# Patient Record
Sex: Male | Born: 1970 | Race: White | Hispanic: No | Marital: Married | Smoking: Never smoker
Health system: Southern US, Community
[De-identification: ages and names within clinical notes are randomized; demographics above are authoritative.]

## PROBLEM LIST (undated history)

## (undated) DIAGNOSIS — K5190 Ulcerative colitis, unspecified, without complications: Secondary | ICD-10-CM

## (undated) DIAGNOSIS — R972 Elevated prostate specific antigen [PSA]: Secondary | ICD-10-CM

## (undated) DIAGNOSIS — G43909 Migraine, unspecified, not intractable, without status migrainosus: Secondary | ICD-10-CM

## (undated) DIAGNOSIS — C61 Malignant neoplasm of prostate: Secondary | ICD-10-CM

## (undated) DIAGNOSIS — C4491 Basal cell carcinoma of skin, unspecified: Secondary | ICD-10-CM

## (undated) DIAGNOSIS — K4090 Unilateral inguinal hernia, without obstruction or gangrene, not specified as recurrent: Secondary | ICD-10-CM

## (undated) DIAGNOSIS — M1990 Unspecified osteoarthritis, unspecified site: Secondary | ICD-10-CM

## (undated) HISTORY — DX: Ulcerative colitis, unspecified, without complications: K51.90

## (undated) HISTORY — DX: Basal cell carcinoma of skin, unspecified: C44.91

## (undated) HISTORY — PX: COLONOSCOPY: SHX174

## (undated) HISTORY — DX: Migraine, unspecified, not intractable, without status migrainosus: G43.909

## (undated) HISTORY — PX: KNEE ARTHROSCOPY: SUR90

## (undated) HISTORY — PX: PROSTATE BIOPSY: SHX241

---

## 2006-05-09 ENCOUNTER — Emergency Department (HOSPITAL_COMMUNITY): Admission: EM | Admit: 2006-05-09 | Discharge: 2006-05-09 | Payer: Self-pay | Admitting: Emergency Medicine

## 2007-10-23 ENCOUNTER — Encounter: Payer: Self-pay | Admitting: Family Medicine

## 2009-01-18 ENCOUNTER — Ambulatory Visit: Payer: Self-pay | Admitting: Family Medicine

## 2009-01-18 DIAGNOSIS — S139XXA Sprain of joints and ligaments of unspecified parts of neck, initial encounter: Secondary | ICD-10-CM | POA: Insufficient documentation

## 2009-01-18 DIAGNOSIS — C4490 Unspecified malignant neoplasm of skin, unspecified: Secondary | ICD-10-CM

## 2009-05-24 ENCOUNTER — Ambulatory Visit: Payer: Self-pay | Admitting: Family Medicine

## 2009-05-25 LAB — CONVERTED CEMR LAB
Alkaline Phosphatase: 68 units/L (ref 39–117)
BUN: 14 mg/dL (ref 6–23)
Cholesterol: 189 mg/dL (ref 0–200)
Glucose, Bld: 107 mg/dL — ABNORMAL HIGH (ref 70–99)
HDL: 44 mg/dL (ref >39)
LDL Cholesterol: 115 mg/dL — ABNORMAL HIGH (ref 0–99)
Total Bilirubin: 0.6 mg/dL (ref 0.3–1.2)
Triglycerides: 149 mg/dL (ref <150)
VLDL: 30 mg/dL (ref 0–40)

## 2009-08-12 HISTORY — PX: KNEE ARTHROPLASTY: SHX992

## 2010-06-19 ENCOUNTER — Ambulatory Visit: Payer: Self-pay | Admitting: Family Medicine

## 2010-06-19 ENCOUNTER — Encounter: Admission: RE | Admit: 2010-06-19 | Discharge: 2010-06-19 | Payer: Self-pay | Admitting: Family Medicine

## 2010-06-19 DIAGNOSIS — M25569 Pain in unspecified knee: Secondary | ICD-10-CM

## 2010-06-21 ENCOUNTER — Encounter: Payer: Self-pay | Admitting: Family Medicine

## 2010-09-11 NOTE — Consult Note (Signed)
Summary: [facility]  [facility]   Imported By: Lanelle Bal 07/09/2010 10:54:17  _____________________________________________________________________  External Attachment:    Type:   Image     Comment:   External Document

## 2010-09-11 NOTE — Assessment & Plan Note (Signed)
Summary: L knee pain   Vital Signs:  Patient profile:   40 year old male Height:      78 inches Weight:      204 pounds BMI:     23.66 O2 Sat:      98 % on Room air Pulse rate:   76 / minute BP sitting:   137 / 76  (left arm) Cuff size:   regular  Vitals Entered By: Payton Spark CMA (June 19, 2010 2:24 PM)  O2 Flow:  Room air CC: Injured L knee while bending x 3 days ago.    Primary Care Asuncion Shibata:  Seymour Bars DO  CC:  Injured L knee while bending x 3 days ago. Marland Kitchen  History of Present Illness: 40 yo WM presents for pain in the L knee that started 3 days ago.  He has been busy hiking and builing a chicken coup.  He had been squatting and when he went to stand up, he felt like the knee was going to give way.  It swelled up later that day.  He used ice and ibuprofen but it still hurts today.  He has pain with pivoting.  Pain is over the medial joint line.  He feels like it is going to give way unless he is walking straight.  He has hx of MCL partial tear at age 40.    Used an ACE wrap yesterday.   Current Medications (verified): 1)  None  Allergies (verified): No Known Drug Allergies  Past History:  Past Medical History: Reviewed history from 01/18/2009 and no changes required. BCC- Dr Danella Deis  Social History: Reviewed history from 01/18/2009 and no changes required. 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  No kids. Has Masters Degree. Quit smoking.  1 pk yr hx. Does Aikido and Walking 60+ min daily. 4 ETOH / wk.  Review of Systems      See HPI  Physical Exam  General:  alert, well-developed, well-nourished, and well-hydrated.  tall, lanky Msk:  L kinee effusion w/o redness or warmth with prominent bakers cyst.  + medial sided McMurrays test.  Pain/ laxity over medial collateral with valgus strain.  neg patellar apprehension and normal lachmans sign Extremities:  no ankle or pedal edema   Impression & Recommendations:  Problem # 1:  KNEE  PAIN, LEFT, ACUTE (ICD-719.46) Based on hx and PE findings, he likely has a medial mediscal tear + / - a medical collateral strain with presence of effuison. Placed in knee sleeve today.  Xray today (likely normal).  Start RX Ibuprofen and ice.  Set up with Dr Margaretha Sheffield for arthrocentesis and steroid shot if indicated.  Likely will need MRI down line. His updated medication list for this problem includes:    Ibuprofen 800 Mg Tabs (Ibuprofen) .Marland Kitchen... 1 tab by mouth three times a day with food x 10 days  Orders: T-DG Knee 3 Views L (61950.6) Sports Medicine (Sports Med) Knee/thigh sleeve [phone])  Complete Medication List: 1)  Ibuprofen 800 Mg Tabs (Ibuprofen) .Marland Kitchen.. 1 tab by mouth three times a day with food x 10 days  Patient Instructions: 1)  Xray knee downstairs today. 2)  Will call you w/ results tomorrow. 3)  Use knee sleeve during the day. 4)  Ice knee 15 min 3 x a day. 5)  Start RX Ibuprofen 3 x a day with meals x 10 days for pain and swelling. 6)  Will set you up with Dr Margaretha Sheffield in [place] to follow. Prescriptions:  IBUPROFEN 800 MG TABS (IBUPROFEN) 1 tab by mouth three times a day with food x 10 days  #30 x 0   Entered and Authorized by:   Seymour Bars DO   Signed by:   Seymour Bars DO on 06/19/2010   Method used:   Electronically to        [facility] [address] [phone]* (retail)       [address]       [place], [place]  [address]       Ph: [phone] or [phone]       Fax: [phone]   RxID:   [id number]    Orders Added: 1)  T-DG Knee 3 Views L [73562.6] 2)  Sports Medicine [Sports Med] 3)  Est. Patient Level III [57322] 4)  Knee/thigh sleeve [L1825]

## 2011-03-17 ENCOUNTER — Encounter: Payer: Self-pay | Admitting: Family Medicine

## 2011-03-18 ENCOUNTER — Other Ambulatory Visit: Payer: Self-pay | Admitting: Family Medicine

## 2011-03-18 MED ORDER — EPINEPHRINE 0.3 MG/0.3ML IJ DEVI: 0.3000 mg | Freq: Once | INTRAMUSCULAR | Status: DC

## 2011-03-18 NOTE — Telephone Encounter (Signed)
Added but you don't have a pharmacy listed.

## 2011-03-18 NOTE — Telephone Encounter (Signed)
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  Was told he needed an epi pen which is reasonable.  Getting ready to go on vacation and would like to get the prescription sent prior to leaving.  Pt last office visit was 06-19-10.  Epi pen not on pt med list in old or new system.  Please advise Routed to Dr. Arlice Colt, LPN [facility]

## 2011-03-19 ENCOUNTER — Telehealth: Payer: Self-pay | Admitting: Family Medicine

## 2011-03-19 ENCOUNTER — Other Ambulatory Visit: Payer: Self-pay | Admitting: Family Medicine

## 2011-03-19 ENCOUNTER — Ambulatory Visit (INDEPENDENT_AMBULATORY_CARE_PROVIDER_SITE_OTHER): Payer: PRIVATE HEALTH INSURANCE | Admitting: Family Medicine

## 2011-03-19 ENCOUNTER — Encounter: Payer: Self-pay | Admitting: Family Medicine

## 2011-03-19 VITALS — BP 133/71 | HR 62 | Temp 98.3°F | Ht 78.0 in | Wt 196.0 lb

## 2011-03-19 DIAGNOSIS — G43909 Migraine, unspecified, not intractable, without status migrainosus: Secondary | ICD-10-CM

## 2011-03-19 DIAGNOSIS — Z91038 Other insect allergy status: Secondary | ICD-10-CM | POA: Insufficient documentation

## 2011-03-19 DIAGNOSIS — Z13228 Encounter for screening for other metabolic disorders: Secondary | ICD-10-CM

## 2011-03-19 DIAGNOSIS — Z91030 Bee allergy status: Secondary | ICD-10-CM | POA: Insufficient documentation

## 2011-03-19 DIAGNOSIS — T6391XA Toxic effect of contact with unspecified venomous animal, accidental (unintentional), initial encounter: Secondary | ICD-10-CM

## 2011-03-19 DIAGNOSIS — T63461A Toxic effect of venom of wasps, accidental (unintentional), initial encounter: Secondary | ICD-10-CM

## 2011-03-19 DIAGNOSIS — Z13220 Encounter for screening for lipoid disorders: Secondary | ICD-10-CM

## 2011-03-19 MED ORDER — NARATRIPTAN HCL 2.5 MG PO TABS: 2.5000 mg | ORAL_TABLET | ORAL | Status: DC | PRN

## 2011-03-19 MED ORDER — EPINEPHRINE 0.3 MG/0.3ML IJ DEVI: 0.3000 mg | Freq: Once | INTRAMUSCULAR | Status: DC

## 2011-03-19 NOTE — Patient Instructions (Signed)
Update fasting labs one morning downstairs. Will call you w/ results.  Change Axert to Naratriptan to use for migraine rescue. Let me know if any problems on this.

## 2011-03-19 NOTE — Assessment & Plan Note (Signed)
RX for Epi Pen given.  Reviewed proper use.  Call if any problems.

## 2011-03-19 NOTE — Progress Notes (Signed)
  Subjective:    Patient ID: Stephen Ferrell, male    DOB: [birth year], 40 y.o.   MRN: [id number]  HPI  40 yo WM presents for RX for Epi Pen.  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  His last reaction was that his leg swelled very large.  Has not had trouble breathing.  He rarely has migraines.  Has used Axert which works well but is not covered by [employer] education [employer].  He tried Maxalt in the past but it caused problems with his eyes.  He gets about 4 migraines/ year.    BP 133/71  Pulse 62  Temp(Src) 98.3 F (36.8 C) (Oral)  Ht 6\' 6"  (1.981 m)  Wt 196 lb (88.905 kg)  BMI 22.65 kg/m2   Review of Systems  Constitutional: Negative for fatigue.  Eyes: Negative for visual disturbance.  Respiratory: Negative for shortness of breath.   Cardiovascular: Negative for chest pain.  Neurological: Positive for headaches (rare).       Objective:   Physical Exam  Constitutional: He appears well-developed and well-nourished.  HENT:  Mouth/Throat: Oropharynx is clear and moist.  Neck: No thyromegaly present.  Cardiovascular: Normal rate, regular rhythm and normal heart sounds.   No murmur heard. Pulmonary/Chest: Effort normal and breath sounds normal.  Musculoskeletal: He exhibits no edema.  Skin: Skin is warm and dry.  Psychiatric: He has a normal mood and affect.          Assessment & Plan:

## 2011-03-19 NOTE — Telephone Encounter (Signed)
The pharmacy is loaded [facility].  LMOM for the pt that the script was sent to his pharmacy. Jarvis Newcomer, LPN [facility] Dimes

## 2011-03-19 NOTE — Telephone Encounter (Signed)
Prescription for Epi-Pen faxed to pt local pharmacy. Jarvis Newcomer, LPN [facility] Dimes

## 2011-03-19 NOTE — Assessment & Plan Note (Signed)
Axert changed to Naratriptan for cost.  Call if this doesn't work or has problems with it.

## 2011-03-20 NOTE — Telephone Encounter (Signed)
Closed encounter °

## 2011-03-21 ENCOUNTER — Telehealth: Payer: Self-pay | Admitting: Family Medicine

## 2011-03-21 NOTE — Telephone Encounter (Signed)
Pls call pt.  I got permission for him to transfer care to Dr Terrilee Files at [facility] in [place].  All he has to do is call the main # here [phone] and ask for Dennison Nancy, RN.   Thanks!

## 2011-03-21 NOTE — Telephone Encounter (Signed)
Pt notified. KJ LPN 

## 2011-06-12 ENCOUNTER — Telehealth: Payer: Self-pay | Admitting: Family Medicine

## 2011-06-12 NOTE — Telephone Encounter (Signed)
Mr. Charlesworth is calling asking for Stephen Ferrell.  Dr. Cathey Endow is ending her practice and he was told that he needs to get in touch with Olegario Messier in order to become a patient of Dr. Lewie Loron.

## 2011-06-13 NOTE — Telephone Encounter (Signed)
Dr. Cathey Endow did discuss this patient with me.  He prefers a DO so will assign to Dr. Katrinka Blazing.  Will have our NP coordinator contact him to set up an appointment.

## 2011-06-13 NOTE — Telephone Encounter (Signed)
Thank you for the information happy to meet this individual

## 2011-06-20 NOTE — Telephone Encounter (Signed)
Left vm for pt to return call to schedule np appt.

## 2011-07-22 ENCOUNTER — Encounter (HOSPITAL_COMMUNITY): Payer: Self-pay | Admitting: Emergency Medicine

## 2011-07-22 ENCOUNTER — Emergency Department (INDEPENDENT_AMBULATORY_CARE_PROVIDER_SITE_OTHER)
Admission: EM | Admit: 2011-07-22 | Discharge: 2011-07-22 | Disposition: A | Discharge status: Home / Self Care | Payer: 59 | Source: Home / Self Care | Attending: Emergency Medicine | Admitting: Emergency Medicine

## 2011-07-22 DIAGNOSIS — R05 Cough: Secondary | ICD-10-CM

## 2011-07-22 MED ORDER — GUAIFENESIN-CODEINE 100-10 MG/5ML PO SYRP: 5.0000 mL | ORAL_SOLUTION | Freq: Two times a day (BID) | ORAL | Status: DC | PRN

## 2011-07-22 NOTE — ED Notes (Signed)
Since last Tuesday cold symptoms getting worse. Start off in head and now seems to have settled in chest. Aches, pains, chills, severe cough headache, sinus pain.

## 2011-07-22 NOTE — ED Provider Notes (Signed)
40 year old male with 6 days of cough. Initially his symptoms included headaches body aches fevers and chills. He treated the symptoms with DayQuil and NyQuil and nasal lavage. He has been feeling much better however he still has a persistent productive cough. He denies any dyspnea or current fevers or chills. He has no personal medical history of asthma or COPD. He feels well overall but does note this persistent cough is bothersome. He did not have a flu shot this year.  PMH reviewed.  ROS as above otherwise neg Medications reviewed. No current facility-administered medications for this encounter.   Current Outpatient Prescriptions  Medication Sig Dispense Refill  . EPINEPHrine (EPIPEN) 0.3 mg/0.3 mL DEVI Inject 0.3 mLs (0.3 mg total) into the muscle once.  1 Device  2  . guaiFENesin-codeine (ROBITUSSIN AC) 100-10 MG/5ML syrup Take 5 mLs by mouth 2 (two) times daily as needed for cough.  240 mL  0  . ibuprofen (ADVIL,MOTRIN) 800 MG tablet Take 800 mg by mouth every 8 (eight) hours as needed.        . naratriptan (AMERGE) 2.5 MG tablet Take 1 tablet (2.5 mg total) by mouth as needed for migraine. Take one (1) tablet at onset of headache; if returns or does not resolve, may repeat after 4 hours; do not exceed five (5) mg in 24 hours.  10 tablet  0    Exam:  BP 113/77  Pulse 71  Temp(Src) 99.1 F (37.3 C) (Oral)  Resp 20  SpO2 97% Gen: Well NAD HEENT: EOMI,  MMM Lungs: Normal work of breathing, rhonchi present bilaterally   normal expiratory phase no crackles or wheezes present Heart: RRR no MRG Abd: NABS, NT, ND Exts: Non edematous BL  LE, warm and well perfused.    Assessment and plan: 40 year old male with post viral cough likely do to influenza. On the recovery currently. Oxygen saturation is normal therefore I feel like x-rays not warranted. We'll treat cough with prescription cough medication. Discuss red flags  signs and symptoms and the duration of expected post viral cough.  Will followup with primary care provider if no improvement in 2 weeks.  Stephen Ferrell 07/22/11 1939

## 2011-07-25 ENCOUNTER — Encounter: Payer: Self-pay | Admitting: Family Medicine

## 2011-07-25 ENCOUNTER — Ambulatory Visit (INDEPENDENT_AMBULATORY_CARE_PROVIDER_SITE_OTHER): Payer: Self-pay | Admitting: Family Medicine

## 2011-07-25 ENCOUNTER — Other Ambulatory Visit: Payer: Self-pay | Admitting: Family Medicine

## 2011-07-25 VITALS — BP 119/72 | HR 73 | Temp 98.2°F | Ht 78.0 in | Wt 192.0 lb

## 2011-07-25 DIAGNOSIS — Z13220 Encounter for screening for lipoid disorders: Secondary | ICD-10-CM

## 2011-07-25 DIAGNOSIS — G43909 Migraine, unspecified, not intractable, without status migrainosus: Secondary | ICD-10-CM

## 2011-07-25 DIAGNOSIS — M25569 Pain in unspecified knee: Secondary | ICD-10-CM

## 2011-07-25 DIAGNOSIS — R5383 Other fatigue: Secondary | ICD-10-CM

## 2011-07-25 DIAGNOSIS — J069 Acute upper respiratory infection, unspecified: Secondary | ICD-10-CM

## 2011-07-25 LAB — CBC WITH DIFFERENTIAL/PLATELET
Basophils Absolute: 0 10*3/uL (ref 0.0–0.1)
Eosinophils Relative: 2 % (ref 0–5)
HCT: 44.5 % (ref 39.0–52.0)
Hemoglobin: 15.8 g/dL (ref 13.0–17.0)
Lymphocytes Relative: 32 % (ref 12–46)
Lymphs Abs: 2.1 10*3/uL (ref 0.7–4.0)
MCV: 89 fL (ref 78.0–100.0)
Monocytes Absolute: 0.4 10*3/uL (ref 0.1–1.0)
Monocytes Relative: 7 % (ref 3–12)
Neutro Abs: 4 10*3/uL (ref 1.7–7.7)
RDW: 12.7 % (ref 11.5–15.5)
WBC: 6.7 10*3/uL (ref 4.0–10.5)

## 2011-07-25 LAB — LIPID PANEL
LDL Cholesterol: 100 mg/dL — ABNORMAL HIGH (ref 0–99)
Total CHOL/HDL Ratio: 4.3 Ratio

## 2011-07-25 LAB — BASIC METABOLIC PANEL
CO2: 29 mEq/L (ref 19–32)
Glucose, Bld: 93 mg/dL (ref 70–99)
Potassium: 4.2 mEq/L (ref 3.5–5.3)
Sodium: 139 mEq/L (ref 135–145)

## 2011-07-25 MED ORDER — ALBUTEROL 90 MCG/ACT IN AERS: 2.0000 | INHALATION_SPRAY | Freq: Four times a day (QID) | RESPIRATORY_TRACT | Status: DC | PRN

## 2011-07-25 NOTE — Progress Notes (Signed)
  Subjective:    Patient ID: Stephen Ferrell, male    DOB: 01-Aug-1971, 40 y.o.   MRN: [id number]  HPI Pt is here to establish care.  Recent flu- Pt stats last week had fever, chills, myalgia, and cough.  Pt was given cough syrup and seemed to improve but still has the cough, dry non productive, denies CP or SOB. Pt able to do all activities without much problems. Pt states it continues to get better.   Cervical strain- Pt was seen at [facility] before for this and partial tear in his meniscus. Pt though is feeling better but moved from [place], received manipulation therapy and would like to have it again.   Migraines- Have been well controlled having them very infrequent.  Pt states that they usually respond to Motrin at this time.   Left knee pain-  Pt states much improved at ths time, has started to workout a little more and is not having any pain.  Pt states he does not even have pain with stairs or cold weather.   Review of Systems Denies fever, chills, nausea vomiting abdominal pain, dysuria, chest pain, shortness of breath dyspnea on exertion or numbness in extremities Past medical history, social, surgical and family history all reviewed.      Objective:   Physical Exam Constitutional: He appears well-developed and well-nourished.  HENT:  Mouth/Throat: Oropharynx is clear and moist.  Neck: No thyromegaly present.  Cardiovascular: Normal rate, regular rhythm and normal heart sounds.   No murmur heard. Pulmonary/Chest: Effort normal and breath sounds normal.  Musculoskeletal: He exhibits no edema.  Skin: Skin is warm and dry.  Psychiatric: He has a normal mood and affect.   OMT Findings: Cervical: C3 flexed rotated and side bent left      C6 extended her rotated and side there Thoracic: T5 extended rotated and side bent right patient also has a right fifth rib inhaled position Lumbar: L3 rotated and side bent left in flexion Sacrum: Left on left with an anterior ilium    Assessment & Plan:

## 2011-07-25 NOTE — Assessment & Plan Note (Signed)
Well controlled no need for medication or management change.

## 2011-07-25 NOTE — Assessment & Plan Note (Signed)
Partial meniscus tear seen on imaging and now though seems well healed will make no changes at this time.

## 2011-07-25 NOTE — Patient Instructions (Signed)
Good to see you. I will get the labs and call you with the results. At that time then your formal be ready for pickup. This likely will be done next week. I'm giving you a inhaler to try to hopefully help with her cough probably use this for the next week or 2. Your more than welcome to come in for manipulation as you need it. Right now there was not much better I would say maybe another time in 6-8 weeks. Otherwise happy holidays and happy new year.

## 2012-04-19 ENCOUNTER — Other Ambulatory Visit: Payer: Self-pay | Admitting: Family Medicine

## 2012-04-29 ENCOUNTER — Other Ambulatory Visit: Payer: Self-pay | Admitting: *Deleted

## 2012-04-29 DIAGNOSIS — T7840XA Allergy, unspecified, initial encounter: Secondary | ICD-10-CM

## 2012-04-29 MED ORDER — EPINEPHRINE 0.3 MG/0.3ML IJ DEVI: 0.3000 mg | Freq: Once | INTRAMUSCULAR | Status: DC

## 2012-11-29 ENCOUNTER — Ambulatory Visit: Payer: PRIVATE HEALTH INSURANCE

## 2012-11-29 ENCOUNTER — Encounter: Payer: Self-pay | Admitting: Family Medicine

## 2012-11-29 ENCOUNTER — Ambulatory Visit (INDEPENDENT_AMBULATORY_CARE_PROVIDER_SITE_OTHER): Payer: PRIVATE HEALTH INSURANCE | Admitting: Family Medicine

## 2012-11-29 DIAGNOSIS — S93409A Sprain of unspecified ligament of unspecified ankle, initial encounter: Secondary | ICD-10-CM

## 2012-11-29 DIAGNOSIS — M25571 Pain in right ankle and joints of right foot: Secondary | ICD-10-CM

## 2012-11-29 DIAGNOSIS — S91309A Unspecified open wound, unspecified foot, initial encounter: Secondary | ICD-10-CM

## 2012-11-29 DIAGNOSIS — M79609 Pain in unspecified limb: Secondary | ICD-10-CM

## 2012-11-29 DIAGNOSIS — Z23 Encounter for immunization: Secondary | ICD-10-CM

## 2012-11-29 DIAGNOSIS — M25579 Pain in unspecified ankle and joints of unspecified foot: Secondary | ICD-10-CM

## 2012-11-29 DIAGNOSIS — S91301A Unspecified open wound, right foot, initial encounter: Secondary | ICD-10-CM

## 2012-11-29 DIAGNOSIS — S93401A Sprain of unspecified ligament of right ankle, initial encounter: Secondary | ICD-10-CM

## 2012-11-29 NOTE — Progress Notes (Signed)
Verbal consent obtained from the patient.  Local anesthesia with 2cc Lidocaine 2% without epinephrine.  Wound scrubbed with soap and water and rinsed.  Wound closed with #3 5-0 Prolene horizontal mattress (#1) and simple interrupted (#2) sutures.  Wound cleansed and dressed.

## 2012-11-29 NOTE — Patient Instructions (Addendum)

## 2012-11-29 NOTE — Progress Notes (Signed)
[address]   [place], [place]  [address]   [phone]  Subjective:    Patient ID: Stephen Ferrell, male    DOB: 05/04/1971, 42 y.o.   MRN: [id number]  HPI This 42 y.o. male presents for evaluation of R foot laceration.  Occurred at 10:30 this morning; one hour ago.  Slipped on ladder.  Tetanus vaccine several years ago; probably over five years.  75 pounds of weight came down on foot.  Some pain.  Ladder landed directly on foot and cut between 4th and 5th digits.  Not interested in xray; history of broken toes multiple in past.    2.  R ankle pain: injured ankle four days ago.  No swelling; Lateral ankle pain; pain with rotating foot side to side.  No n/t.  No limping.  Pain less severe now.    PCP: Deboraha Sprang   Review of Systems  Constitutional: Negative for fever, chills, diaphoresis and fatigue.  Musculoskeletal: Positive for myalgias, arthralgias and gait problem. Negative for joint swelling.  Skin: Positive for wound.  Neurological: Negative for weakness and numbness.        Past Medical History  Diagnosis Date  . BCC (basal cell carcinoma of skin)   . Migraines     Past Surgical History  Procedure Laterality Date  . Knee arthroplasty  2011    Prior to Admission medications   Medication Sig Start Date End Date Taking? Authorizing Provider  ibuprofen (ADVIL,MOTRIN) 800 MG tablet Take 800 mg by mouth every 8 (eight) hours as needed.     Yes Historical Provider, MD  albuterol (PROVENTIL,VENTOLIN) 90 MCG/ACT inhaler Inhale 2 puffs into the lungs every 6 (six) hours as needed for wheezing. 07/25/11 07/19/12  Judi Saa, DO  EPINEPHrine (EPIPEN) 0.3 mg/0.3 mL DEVI Inject 0.3 mLs (0.3 mg total) into the muscle once. 04/29/12   Ozella Rocks, MD  naratriptan (AMERGE) 2.5 MG tablet Take 1 tablet (2.5 mg total) by mouth as needed for migraine. Take one (1) tablet at onset of headache; if returns or does not resolve, may repeat after 4 hours; do not exceed five (5) mg in 24  hours. 03/19/11 03/18/12  Seymour Bars, DO    No Known Allergies  History   Social History  . Marital Status: Married    Spouse Name: N/A    Number of Children: N/A  . Years of Education: N/A   Occupational History  . Not on file.   Social History Main Topics  . Smoking status: Former Games developer  . Smokeless tobacco: Not on file  . Alcohol Use: 2.0 oz/week    4 drink(s) per week     Comment: per week  . Drug Use:   . Sexually Active:    Other Topics Concern  . Not on file   Social History Narrative  . No narrative on file    Family History  Problem Relation Age of Onset  . Cancer Mother     melanoma, breast cancer  . Hypertension Father   . Hyperlipidemia Father   . Mental illness Brother     Objective:   Physical Exam  Nursing note and vitals reviewed. Constitutional: He is oriented to person, place, and time. He appears well-developed and well-nourished. No distress.  Cardiovascular: Intact distal pulses.   Pulses:      Dorsalis pedis pulses are 2+ on the right side, and 2+ on the left side.  Musculoskeletal:       Right ankle: He  exhibits normal range of motion, no swelling, no ecchymosis, no deformity, no laceration and normal pulse. No tenderness. No lateral malleolus, no medial malleolus and no proximal fibula tenderness found. Achilles tendon normal. Achilles tendon exhibits no pain, no defect and normal Thompson's test results.       Right foot: He exhibits tenderness and laceration. He exhibits normal range of motion, no bony tenderness, no swelling and no deformity.       Feet:  R FOOT:  +TTP DISTAL FIFTH METATARSAL; 5TH TOE WITHOUT TTP; NORMAL ROM DIGITS X 5.  NO TTP METATARSAL SQUEEZE.  Neurological: He is alert and oriented to person, place, and time.  Skin: He is not diaphoretic.  R FOOT:  +LACERATION INTERDIGIT 4TH, 5TH WITH SCANT BLEEDING.  Psychiatric: He has a normal mood and affect. His behavior is normal.   TDAP ADMINISTERED IN OFFICE BY  REBEKAH.  [facility] reading (PRIMARY) by  Dr. Katrinka Blazing.  R FOOT: NAD.  R ANKLE:  NAD  PROCEDURE NOTE: SEE SEPARATE PROCEDURE NOTE.        Assessment & Plan:  Pain, foot, right - Plan: DG Foot Complete Right  Wound, open, foot, right, initial encounter - Plan: Tdap vaccine greater than or equal to 7yo IM  Pain in joint, ankle and foot, right - Plan: DG Ankle Complete Right  Sprain of ankle, right, initial encounter  1. Pain R foot:  New.  Secondary to contusion, laceration. 2.  Wound/laceration R foot:  New.  S/p suture repair; s/p TDAP in office.  RTC 7-10 days for suture removal.  Local wound care; RTC for drainage, redness, pain. 3.  Contusion R foot:  New.  Secondary to trauma from ladder.  Recommend rest, elevation, Ibuprofen, ice. 4.  Pain R ankle; sprain R lateral ankle: New.  Secondary to trauma; recommend rest, passive ROM, Ibuprofen, ice.

## 2015-01-23 ENCOUNTER — Other Ambulatory Visit: Payer: Self-pay | Admitting: Internal Medicine

## 2015-01-23 ENCOUNTER — Ambulatory Visit
Admission: RE | Admit: 2015-01-23 | Discharge: 2015-01-23 | Disposition: A | Discharge status: Home / Self Care | Payer: No Typology Code available for payment source | Source: Ambulatory Visit | Attending: Internal Medicine | Admitting: Internal Medicine

## 2015-01-23 DIAGNOSIS — M545 Low back pain: Secondary | ICD-10-CM

## 2015-01-25 ENCOUNTER — Other Ambulatory Visit: Payer: Self-pay | Admitting: Internal Medicine

## 2015-01-25 DIAGNOSIS — M545 Low back pain: Secondary | ICD-10-CM

## 2015-02-06 ENCOUNTER — Ambulatory Visit
Admission: RE | Admit: 2015-02-06 | Discharge: 2015-02-06 | Disposition: A | Discharge status: Home / Self Care | Payer: PRIVATE HEALTH INSURANCE | Source: Ambulatory Visit | Attending: Internal Medicine | Admitting: Internal Medicine

## 2015-02-06 DIAGNOSIS — M545 Low back pain: Secondary | ICD-10-CM

## 2016-03-07 ENCOUNTER — Emergency Department (HOSPITAL_COMMUNITY)
Admission: EM | Admit: 2016-03-07 | Discharge: 2016-03-08 | Disposition: A | Discharge status: Home / Self Care | Payer: 59 | Attending: Emergency Medicine | Admitting: Emergency Medicine

## 2016-03-07 ENCOUNTER — Emergency Department (HOSPITAL_COMMUNITY): Payer: 59

## 2016-03-07 ENCOUNTER — Encounter (HOSPITAL_COMMUNITY): Payer: Self-pay | Admitting: *Deleted

## 2016-03-07 DIAGNOSIS — Z87891 Personal history of nicotine dependence: Secondary | ICD-10-CM | POA: Diagnosis not present

## 2016-03-07 DIAGNOSIS — Y9301 Activity, walking, marching and hiking: Secondary | ICD-10-CM | POA: Diagnosis not present

## 2016-03-07 DIAGNOSIS — Y9289 Other specified places as the place of occurrence of the external cause: Secondary | ICD-10-CM | POA: Insufficient documentation

## 2016-03-07 DIAGNOSIS — W01198A Fall on same level from slipping, tripping and stumbling with subsequent striking against other object, initial encounter: Secondary | ICD-10-CM | POA: Diagnosis not present

## 2016-03-07 DIAGNOSIS — S81012A Laceration without foreign body, left knee, initial encounter: Secondary | ICD-10-CM | POA: Insufficient documentation

## 2016-03-07 DIAGNOSIS — Y999 Unspecified external cause status: Secondary | ICD-10-CM | POA: Diagnosis not present

## 2016-03-07 DIAGNOSIS — S81812A Laceration without foreign body, left lower leg, initial encounter: Secondary | ICD-10-CM

## 2016-03-07 MED ORDER — LIDOCAINE-EPINEPHRINE 2 %-1:100000 IJ SOLN
20.0000 mL | Freq: Once | INTRAMUSCULAR | Status: AC
  Administered 2016-03-08: 20 mL
  Filled 2016-03-07: qty 1

## 2016-03-07 NOTE — ED Notes (Signed)
Wound irrigated with saline at this time.

## 2016-03-07 NOTE — ED Provider Notes (Signed)
[facility] DEPT Provider Note   CSN: [id number] Arrival date & time: 03/07/16  2159  First Provider Contact:  None       History   Chief Complaint Chief Complaint  Patient presents with  . Extremity Laceration    HPI Stephen Ferrell is a 45 y.o. male with no significant pmhx who presents to the ED today c/o laceration to left knee. Pt states that he was walking out on his back patio in the dark when he accidentally tripped and landed on some stones. Pt states that he accidentally kicked one of the stones with his left foot and landed on his left knee causing a laceration. Pt is concerned that he may have broken his patella. He has been ambulatory without difficulty since. No other trauma or injury. Last tetanus was 4 years ago.   HPI  Past Medical History:  Diagnosis Date  . BCC (basal cell carcinoma of skin)   . Migraines     Patient Active Problem List   Diagnosis Date Noted  . Screening cholesterol level 07/25/2011  . Fatigue 07/25/2011  . URI (upper respiratory infection) 07/25/2011  . 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 Migraines 03/19/2011  . KNEE PAIN, LEFT, ACUTE 06/19/2010  . CARCINOMA, BASAL CELL 01/18/2009  . CERVICAL STRAIN 01/18/2009    Past Surgical History:  Procedure Laterality Date  . KNEE ARTHROPLASTY  2011       Home Medications    Prior to Admission medications   Medication Sig Start Date End Date Taking? Authorizing Provider  albuterol (PROVENTIL,VENTOLIN) 90 MCG/ACT inhaler Inhale 2 puffs into the lungs every 6 (six) hours as needed for wheezing. 07/25/11 07/19/12  Lyndal Pulley, DO  EPINEPHrine (EPIPEN) 0.3 mg/0.3 mL DEVI Inject 0.3 mLs (0.3 mg total) into the muscle once. 04/29/12   Waldemar Dickens, MD  ibuprofen (ADVIL,MOTRIN) 800 MG tablet Take 800 mg by mouth every 8 (eight) hours as needed.      Historical Provider, MD  naratriptan (AMERGE) 2.5 MG tablet Take 1 tablet (2.5 mg total) by mouth as needed for migraine. Take one (1) tablet  at onset of headache; if returns or does not resolve, may repeat after 4 hours; do not exceed five (5) mg in 24 hours. 03/19/11 03/18/12  Dell Ponto, DO    Family History Family History  Problem Relation Age of Onset  . Cancer Mother     melanoma, breast cancer  . Hypertension Father   . Hyperlipidemia Father   . Mental illness Brother     Social History Social History  Substance Use Topics  . Smoking status: Former Research scientist (life sciences)  . Smokeless tobacco: Never Used  . Alcohol use 2.0 oz/week    4 Standard drinks or equivalent per week     Comment: per week     Allergies   Review of patient's allergies indicates no known allergies.   Review of Systems Review of Systems  All other systems reviewed and are negative.    Physical Exam Updated Vital Signs BP 118/83 (BP Location: Left Arm)   Pulse 71   Temp 98.3 F (36.8 C) (Oral)   SpO2 96%   Physical Exam  Constitutional: He is oriented to person, place, and time. He appears well-developed and well-nourished. No distress.  HENT:  Head: Normocephalic and atraumatic.  Eyes: Conjunctivae are normal. Right eye exhibits no discharge. Left eye exhibits no discharge. No scleral icterus.  Cardiovascular: Normal rate.   Pulmonary/Chest: Effort normal.  Musculoskeletal:  Negative  anterior/poster drawer bilaterally. Negative ballottement test. No varus or valgus laxity. No crepitus. No decreased range of motion of knee. Patient able to lift up the left leg without difficulty.  Neurological: He is alert and oriented to person, place, and time. Coordination normal.  Skin: Skin is warm and dry. No rash noted. He is not diaphoretic. No erythema. No pallor.  3 cm horizontal laceration across the anterior aspect of left knee. Wound is dirty with multiple specks of grass and dirt. No edema, erythema or warmth.  Psychiatric: He has a normal mood and affect. His behavior is normal.  Nursing note and vitals reviewed.    ED Treatments / Results    Labs (all labs ordered are listed, but only abnormal results are displayed) Labs Reviewed - No data to display  EKG  EKG Interpretation None       Radiology Dg Knee Complete 4 Views Left  Result Date: 03/07/2016 CLINICAL DATA:  Post fall, now with laceration near the patella. EXAM: LEFT KNEE - COMPLETE 4+ VIEW COMPARISON:  None. FINDINGS: There is a suspected laceration involving the soft tissues anterior to the patella. No associated radiopaque foreign body or fracture. Suspected small joint effusion. Joint spaces are preserved. No evidence of chondrocalcinosis. IMPRESSION: 1. Small laceration involving the soft tissues anterior the patella without associated fracture radiopaque foreign body. 2. Suspected small elbow joint effusion. Electronically Signed   By: Sandi Mariscal M.D.   On: 03/07/2016 23:05  Dg Foot Complete Left  Result Date: 03/07/2016 CLINICAL DATA:  Post fall, now with left foot pain. EXAM: LEFT FOOT - COMPLETE 3+ VIEW COMPARISON:  None. FINDINGS: No fracture or dislocation. Joint spaces are preserved. No significant hallux valgus deformity. No erosions. Regional soft tissues appear normal. No radiopaque foreign body. Prominent osteophyte is noted arising from the cranial aspect of talar beak, potentially the sequela of remote avulsive injury versus an accessory ossicle. IMPRESSION: No acute findings. Electronically Signed   By: Sandi Mariscal M.D.   On: 03/07/2016 23:04   Procedures .Marland KitchenLaceration Repair Date/Time: 03/08/2016 1:18 AM Performed by: Donnald Garre TRIPP Authorized by: Donnald Garre TRIPP   Consent:    Consent obtained:  Verbal   Consent given by:  Patient   Risks discussed:  Infection, retained foreign body and pain   Alternatives discussed:  No treatment Anesthesia (see MAR for exact dosages):    Anesthesia method:  Local infiltration   Local anesthetic:  Lidocaine 2% WITH epi Laceration details:    Location:  Leg   Leg location:  L knee    Length (cm):  3   Depth (mm):  1 Pre-procedure details:    Preparation:  Patient was prepped and draped in usual sterile fashion and imaging obtained to evaluate for foreign bodies Exploration:    Hemostasis achieved with:  Epinephrine   Wound exploration: entire depth of wound probed and visualized     Wound extent: foreign bodies/material     Wound extent: no nerve damage noted, no tendon damage noted and no underlying fracture noted   Treatment:    Area cleansed with:  Betadine   Amount of cleaning:  Standard   Irrigation solution:  Sterile water   Irrigation method:  Pressure wash   Visualized foreign bodies/material removed: yes   Skin repair:    Repair method:  Sutures   Suture size:  3-0   Suture material:  Prolene   Suture technique:  Simple interrupted   Number of sutures:  6 Approximation:  Approximation:  Close   Vermilion border: well-aligned   Post-procedure details:    Dressing:  Antibiotic ointment and non-adherent dressing   Patient tolerance of procedure:  Tolerated well, no immediate complications   (including critical care time)  Medications Ordered in ED Medications  lidocaine-EPINEPHrine (XYLOCAINE W/EPI) 2 %-1:100000 (with pres) injection 20 mL (not administered)     Initial Impression / Assessment and Plan / ED Course  I have reviewed the triage vital signs and the nursing notes.  Pertinent labs & imaging results that were available during my care of the patient were reviewed by me and considered in my medical decision making (see chart for details).  Clinical Course   Tdap UTD. Pressure irrigation performed. Laceration occurred < 8 hours prior to repair which was well tolerated. Pt has no co morbidities to effect normal wound healing. Discussed suture home care w pt and answered questions. Pt to f-u for wound check and suture removal in 7 days. Pt is hemodynamically stable w no complaints prior to dc.     Final Clinical Impressions(s) / ED  Diagnoses   Final diagnoses:  Leg laceration, left, initial encounter    New Prescriptions New Prescriptions   No medications on file     Carlos Levering, PA-C 03/08/16 0122    Charlesetta Shanks, MD 03/14/16 1640

## 2016-03-08 MED ORDER — BACITRACIN ZINC 500 UNIT/GM EX OINT
TOPICAL_OINTMENT | Freq: Once | CUTANEOUS | Status: AC
  Administered 2016-03-08: 1 via TOPICAL
  Filled 2016-03-08: qty 28.35

## 2016-03-08 NOTE — ED Notes (Signed)
Pt given discharge instructions, verbalized understanding of need to follow up, reasons to return to the ED and medications to take at home for continued pain. Pt denied further questions or concerns. Pt able to ambulate without difficulty.

## 2016-03-08 NOTE — Discharge Instructions (Signed)
May wash with antibacterial soap and water. Otherwise, keep clean and dry. Follow up with your primary care provider or an  urgent care for suture removal in 7 days. Return to the ED if you experience severe worsening of your symptoms, redness or swelling around your wound, fevers or chills. Try to avoid bending your knee in order to prevent your sutures from popping out.

## 2016-07-15 ENCOUNTER — Encounter (INDEPENDENT_AMBULATORY_CARE_PROVIDER_SITE_OTHER): Payer: Self-pay

## 2016-07-15 ENCOUNTER — Ambulatory Visit (INDEPENDENT_AMBULATORY_CARE_PROVIDER_SITE_OTHER): Payer: PRIVATE HEALTH INSURANCE | Admitting: Allergy & Immunology

## 2016-07-15 ENCOUNTER — Encounter: Payer: Self-pay | Admitting: Allergy & Immunology

## 2016-07-15 VITALS — BP 122/64 | HR 56 | Temp 97.6°F | Resp 16 | Ht 76.58 in | Wt 185.8 lb

## 2016-07-15 DIAGNOSIS — K51919 Ulcerative colitis, unspecified with unspecified complications: Secondary | ICD-10-CM

## 2016-07-15 DIAGNOSIS — T63441S Toxic effect of venom of bees, accidental (unintentional), sequela: Secondary | ICD-10-CM

## 2016-07-15 DIAGNOSIS — T781XXD Other adverse food reactions, not elsewhere classified, subsequent encounter: Secondary | ICD-10-CM | POA: Diagnosis not present

## 2016-07-15 DIAGNOSIS — IMO0001 Reserved for inherently not codable concepts without codable children: Secondary | ICD-10-CM

## 2016-07-15 MED ORDER — AUVI-Q 0.3 MG/0.3ML IJ SOAJ: INTRAMUSCULAR | 3 refills | Status: DC

## 2016-07-15 NOTE — Progress Notes (Signed)
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 Adverse food reactions - Testing showed: positives to chicken, scallops, almond - Testing was mildly reactive to soy, tomato, cottonseed, flounder - We will get blood testing for a pepper panel. - We will call you in one week with the results.  - I did have a long conversation with Mr. Cotta regarding the low positive predictive value of food allergy testing, therefore these results could be false positives. - In contrast, food allergy testing has a high negative predictive value, therefore if they are negative we can be relatively assured that they are indeed negative.   2. Hymenoptera allergy - History is consistent with a reaction that would be an indication for immunotherapy. - Mr. Hylton is dealing with this by avoiding all exposures to stinging insects.  - We will send in an Bystrom as a means of providing additional protection, should he need it.   3. Return in about 6 months (around 01/13/2017).   Subjective:   Stephen Ferrell is a 45 y.o. male presenting today for evaluation of  Chief Complaint  Patient presents with  . GI Problem    Recently diagnosed with Ulcerative Colitis. Wants to make sure he is not allergic to any foods.  Stephen Ferrell has a history of the following: Patient Active Problem List   Diagnosis Date Noted  . Screening cholesterol level 07/25/2011  . Fatigue 07/25/2011  . URI (upper respiratory infection) 07/25/2011  . 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 Migraines 03/19/2011  . KNEE PAIN, LEFT, ACUTE 06/19/2010  . CARCINOMA, BASAL CELL 01/18/2009  . CERVICAL STRAIN 01/18/2009    History obtained from: chart review and patient.  Stephen Ferrell was referred by [name], MD.     Jesselee is a 45 y.o. male presenting for an evaluation of food allergies. He was  recently diagnosed with ulcerative colitis in July 2017. He is wanting to make sure that he is not allergic to any foods that might be worsening his clinical presentation. Overall he is interested in a more "holistic" approach to his symptoms. He wants to make sure that he is "not putting stuff into his body" that he is allergic to.   The ulcerative colitis was diagnosed this past summer. He had a colonoscopy which made the official diagnosed. He was started on measlamine in June 2017. However he thinks he had the symptoms for quite some time - for years perhaps. He has been diagnosed anal fissures, hemorrhoids, wheat intolerance, etc without getting referred to see Gastroenterology. He finally was referred to Gastroenterology and sees Dr. Wonda Horner at [facility]. His is going to see Dr. Penelope Coop again this coming week.   Overall, Mr. Voris feels that his diet has a lot to do with how his ulcerative colitis flares. Triggering foods include tomatoes, peppers, and items with a "nondigestible" skin. He also thinks that orange juice might be leading to problems as well as spicy foods. He has cut beef and alcohol out of his diet with improvement. He is having problems with when and how to introduce fiber since it worsens with ulcerative colitis flares. Most of his fiber is in the form of pureed soups. He has been doing a gluten free diet since 2015 with some improvement. He has been keeping a food diary although he does not bring it with him today.  Overall he has last 20+ pounds since the diagnosis, both intentionally with removal of beef and alcohol from his diet as well unintentional weight loss from the ulcerative colitis flares.   Mr. Semper does not have a history of environmental allergies or asthma. He does have an allergy to bees but does not carry an EpiPen. He had just large local reactions (with swelling up to his shoulder from being stung on his right hand). He did carry an EpiPen  but has never used it. Therefore he just avoids them entirely.    Otherwise, there is no history of other atopic diseases, including asthma, drug allergies, or urticaria. There is no significant infectious history. Vaccinations are up to date.    Past Medical History: Patient Active Problem List   Diagnosis Date Noted  . Screening cholesterol level 07/25/2011  . Fatigue 07/25/2011  . URI (upper respiratory infection) 07/25/2011  . 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 Migraines 03/19/2011  . KNEE PAIN, LEFT, ACUTE 06/19/2010  . CARCINOMA, BASAL CELL 01/18/2009  . CERVICAL STRAIN 01/18/2009    Medication List:    Medication List       Accurate as of 07/15/16  8:37 PM. Always use your most recent med list.          AUVI-Q 0.3 mg/0.3 mL Soaj injection Generic drug:  EPINEPHrine Use as directed for life-threatening allergic reaction.   mesalamine 1.2 g EC tablet Commonly known as:  LIALDA Take 4.8 g by mouth daily.       Birth History: non-contributory. Born at term without complications. He was born in [place], [place].   Developmental History: Shjon has met all milestones on time. He has required no speech therapy, occupational therapy, or physical therapy.   Past Surgical History: Past Surgical History:  Procedure Laterality Date  . KNEE ARTHROPLASTY Left 2011     Family History: Family History  Problem Relation Age of Onset  . Cancer Mother     melanoma, breast cancer  . Hypertension Father   . Hyperlipidemia Father   . Eczema Father   . Mental illness Brother      Social History: Demitrious lives at home with his wife. There are no children. The patient lives in a 47 year old home. There is hardwood in rugs throughout the home. There is no mold exposure. He has gas heating and central cooling. There are cats inside the home as well as Chickens outside of the home. He does have urges in his home. There aren't dust mite coverings on the bedding. There is no tobacco  exposure. 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     Review of Systems: a 14-point review of systems is pertinent for what is mentioned in HPI.  Otherwise, all other systems were negative. Constitutional: negative other than that listed in the HPI Eyes: negative other than that listed in the HPI Ears, nose, mouth, throat, and face: negative other than that listed in the HPI Respiratory: negative other than that listed in the HPI Cardiovascular: negative other than that listed in the HPI Gastrointestinal: negative other than that listed in the HPI Genitourinary: negative other than that listed in the HPI Integument: negative other than that listed in the HPI Hematologic: negative other than that listed in the HPI Musculoskeletal: negative other than that listed in the HPI Neurological: negative other than that listed in the HPI Allergy/Immunologic: negative other than that listed in the HPI  Objective:   Blood pressure 122/64, pulse (!) 56, temperature 97.6 F (36.4 C), temperature source Oral, resp. rate 16, height 6' 4.58" (1.945 m), weight 185 lb 12.8 oz (84.3 kg). Body mass index is 22.28 kg/m.   Physical Exam:  General: Alert, interactive, in no acute distress. Cooperative with the exam. Tall bearded male. HEENT: TMs pearly gray, turbinates edematous and pale without discharge, post-pharynx mildly erythematous. Neck: Supple without thyromegaly. Adenopathy: no enlarged lymph nodes appreciated in the anterior cervical, occipital, axillary, epitrochlear, inguinal, or popliteal regions Lungs: Clear to auscultation without wheezing, rhonchi or rales. No increased work of breathing. CV: Normal S1/S2, no murmurs. Capillary refill <2 seconds.  Abdomen: Nondistended, nontender. No guarding or rebound tenderness. Bowel sounds present in all fields and hypoactive  Skin: Warm and dry, without lesions or  rashes. Extremities:  No clubbing, cyanosis or edema. Neuro:   Grossly intact.  Diagnostic studies:   Allergy Studies:   Entire Foods Panel: Positive to chicken (2+), scallops (2+), almond (2+). Equivocal to soy, tomato, cottonseed, and flounder. Otherwise negative to the remainder of the food panel.     Salvatore Marvel, MD [facility]

## 2016-07-15 NOTE — Patient Instructions (Addendum)
1. Adverse food reaction - Testing showed: positives to chicken, scallops, almond - Testing was mildly reactive to soy, tomato, cottonseed, flounder, - We will get blood testing for a pepper panel. - We will call you in one week with the results.   2. Return in about 6 months (around 01/13/2017).  Please inform us of any Emergency Department visits, hospitalizations, or changes in symptoms. Call us before going to the ED for breathing or allergy symptoms since we might be able to fit you in for a sick visit. Feel free to contact us anytime with any questions, problems, or concerns.  It was a pleasure to meet you today! Have a wonderful holiday season!   Websites that have reliable patient information: 1. American Academy of Asthma, Allergy, and Immunology: www.aaaai.org 2. Food Allergy Research and Education (FARE): foodallergy.org 3. Mothers of Asthmatics: http://www.asthmacommunitynetwork.org 4. American College of Allergy, Asthma, and Immunology: www.acaai.org

## 2016-07-17 ENCOUNTER — Telehealth: Payer: Self-pay | Admitting: Allergy and Immunology

## 2016-07-17 NOTE — Telephone Encounter (Signed)
Patient wife requested list/results of what was tested to be mailed to them.  Put same in mail today.

## 2016-07-17 NOTE — Telephone Encounter (Signed)
L/m for wife to call office

## 2016-07-17 NOTE — Telephone Encounter (Signed)
Pt wife called about the skin test and would like for someone to call and explain what they tested for. [phone]

## 2016-07-18 ENCOUNTER — Other Ambulatory Visit: Payer: Self-pay

## 2016-07-18 NOTE — Telephone Encounter (Signed)
Error

## 2016-07-25 ENCOUNTER — Other Ambulatory Visit: Payer: Self-pay | Admitting: *Deleted

## 2016-07-29 ENCOUNTER — Other Ambulatory Visit: Payer: Self-pay

## 2016-07-29 DIAGNOSIS — T781XXA Other adverse food reactions, not elsewhere classified, initial encounter: Secondary | ICD-10-CM

## 2016-07-29 MED ORDER — AUVI-Q 0.3 MG/0.3ML IJ SOAJ: INTRAMUSCULAR | 0 refills | Status: DC

## 2016-07-30 ENCOUNTER — Telehealth: Payer: Self-pay | Admitting: Allergy & Immunology

## 2016-07-30 NOTE — Telephone Encounter (Signed)
Spoke to pharmacy d/c auvi q it was sent to [facility] pharmacy

## 2016-07-30 NOTE — Telephone Encounter (Signed)
Pharmacy called and said Stephen Ferrell was given a prescription for Auvi-Q and his insurance will cover it, but it is hundreds of dollars. He said an Epi Pen .3 would work for him and would be less expensive. Would like to know if this could be approved.

## 2016-08-09 ENCOUNTER — Telehealth: Payer: Self-pay | Admitting: Allergy & Immunology

## 2016-08-09 ENCOUNTER — Telehealth: Payer: Self-pay | Admitting: *Deleted

## 2016-08-09 DIAGNOSIS — Z91018 Allergy to other foods: Secondary | ICD-10-CM

## 2016-08-09 DIAGNOSIS — T7840XA Allergy, unspecified, initial encounter: Secondary | ICD-10-CM

## 2016-08-09 NOTE — Telephone Encounter (Signed)
Patients wife called and was wondering if we could order lab work for a pepper panel because it was mentioned at last office visit but was not ordered and she wanted him to go today for insurance purposes. Please advised.

## 2016-08-09 NOTE — Telephone Encounter (Signed)
Pt wife called and wanted to know were he is suppose to go for blood work for the peppers, he want to go today because they have met their dedutable. [phone].

## 2016-08-13 LAB — ALLERGEN,GRN PEPPER,PAPRIKA,F218: Allergen,Grn Pepper,Paprika,f218: <0.1 kU/L

## 2016-08-13 LAB — ALLERGEN, BLACK PEPPER,F280

## 2018-02-24 ENCOUNTER — Telehealth: Payer: Self-pay | Admitting: Allergy & Immunology

## 2018-02-24 ENCOUNTER — Other Ambulatory Visit: Payer: Self-pay | Admitting: Allergy & Immunology

## 2018-02-24 DIAGNOSIS — T781XXA Other adverse food reactions, not elsewhere classified, initial encounter: Secondary | ICD-10-CM

## 2018-02-24 MED ORDER — AUVI-Q 0.3 MG/0.3ML IJ SOAJ: INTRAMUSCULAR | 0 refills | Status: DC

## 2018-02-24 NOTE — Telephone Encounter (Signed)
Left message for patient advising of need to be sent to [facility]. Will await patient call back before sending.

## 2018-02-24 NOTE — Telephone Encounter (Signed)
Pt wife called and said that the Auvi-Q had expired and need a new one called into [facility] because they have found bee nest around there house and the wife got stung yesterday [phone].

## 2018-02-24 NOTE — Telephone Encounter (Signed)
Also needs appt.

## 2018-02-24 NOTE — Telephone Encounter (Signed)
Prescription has been sent in as requested.

## 2018-02-25 MED ORDER — AUVI-Q 0.3 MG/0.3ML IJ SOAJ: INTRAMUSCULAR | 0 refills | Status: AC

## 2018-02-25 NOTE — Telephone Encounter (Signed)
I spoke with patient and he is okay with prescription being sent to [facility].

## 2019-07-26 ENCOUNTER — Other Ambulatory Visit: Payer: Self-pay | Admitting: Urology

## 2019-07-26 DIAGNOSIS — R972 Elevated prostate specific antigen [PSA]: Secondary | ICD-10-CM

## 2019-08-20 ENCOUNTER — Ambulatory Visit
Admission: RE | Admit: 2019-08-20 | Discharge: 2019-08-20 | Disposition: A | Discharge status: Home / Self Care | Payer: Commercial Managed Care - PPO | Source: Ambulatory Visit | Attending: Urology | Admitting: Urology

## 2019-08-20 ENCOUNTER — Other Ambulatory Visit: Payer: Self-pay

## 2019-08-20 DIAGNOSIS — R972 Elevated prostate specific antigen [PSA]: Secondary | ICD-10-CM

## 2019-08-20 MED ORDER — GADOBENATE DIMEGLUMINE 529 MG/ML IV SOLN
17.0000 mL | Freq: Once | INTRAVENOUS | Status: AC | PRN
  Administered 2019-08-20: 17 mL via INTRAVENOUS

## 2019-08-23 ENCOUNTER — Other Ambulatory Visit: Payer: PRIVATE HEALTH INSURANCE

## 2020-07-21 ENCOUNTER — Ambulatory Visit: Payer: Commercial Managed Care - PPO | Attending: Internal Medicine

## 2020-07-21 DIAGNOSIS — Z23 Encounter for immunization: Secondary | ICD-10-CM

## 2020-07-21 NOTE — Progress Notes (Signed)
   Covid-19 Vaccination Clinic  Name:  Stephen Ferrell    MRN: [id number] DOB: 19-Feb-1971  07/21/2020  [name] was observed post Covid-19 immunization for 30 minutes based on pre-vaccination screening without incident. He was provided with Vaccine Information Sheet and instruction to access the V-Safe system.   [name] was instructed to call 911 with any severe reactions post vaccine: Marland Kitchen Difficulty breathing  . Swelling of face and throat  . A fast heartbeat  . A bad rash all over body  . Dizziness and weakness   Immunizations Administered    No immunizations on file.

## 2020-11-04 IMAGING — MR MR PROSTATE WO/W CM
43 series · 48 of 48 positions shown · IV contrast (17 ml multihance)
Comparison: None.

CLINICAL DATA: Elevated PSA. Most recent PSA of 6.9. Biopsy
eighteen months ago.

EXAM:
MR PROSTATE WITHOUT AND WITH CONTRAST
TECHNIQUE: Multiplanar multisequence MRI images were obtained of the pelvis
centered about the prostate. Pre and post contrast images were
obtained.
CONTRAST:  17mL MULTIHANCE GADOBENATE DIMEGLUMINE 529 MG/ML IV SOLN

[Series 6: T2 · coronal · 3.5mm · 0.56mm/px · 1 of 23 slices shown (1 of 2)]
[im 1/23]
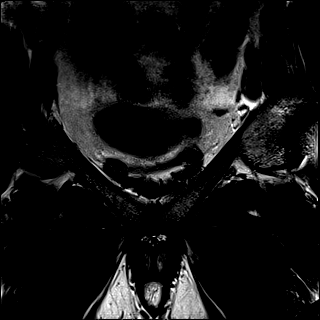

[Series 7: DWI · axial · 3.5mm · 1.56mm/px · z∈[-52,+15]mm · 3 of 60 slices shown (1 of 2)]
[im 1/60]
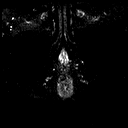
[im 30/60]
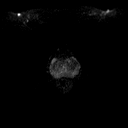
[im 60/60]
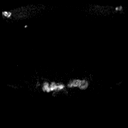

[Series 8: DWI · axial · 3.5mm · 1.56mm/px · 1 of 20 slices shown (2 of 2)]
[im 1/20]
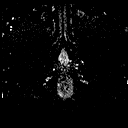

[Series 9: T1 · axial · 3.0mm · 0.31mm/px · 1 of 24 slices shown]
[im 1/24]
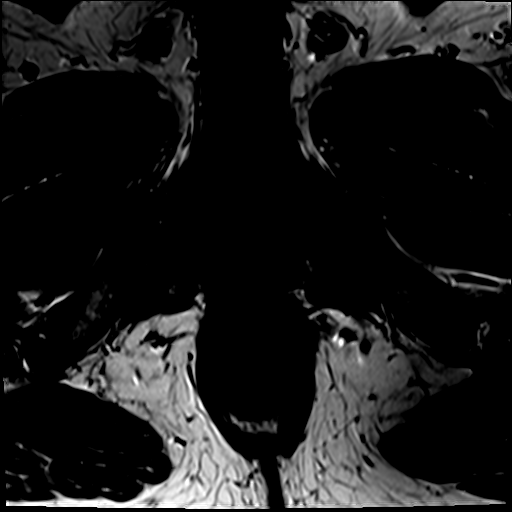

[Series 11: T2 · axial · 1.0mm · 1.04mm/px · z∈[-58,+21]mm · 4 of 80 slices shown (2 of 2)]
[im 1/80]
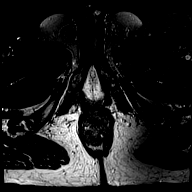
[im 27/80]
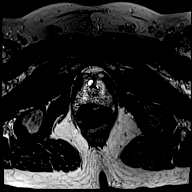
[im 53/80]
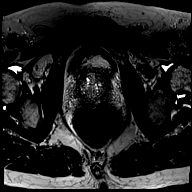
[im 80/80]
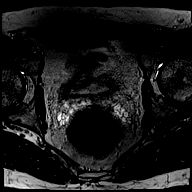

[Series 14: post t1_twist_tra_dyn-copy center · axial · 3.5mm · 0.83mm/px · 1 of 20 slices shown (1 of 20)]
[im 1/20]
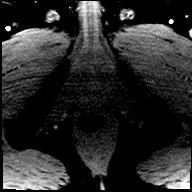

[Series 18: post t1_twist_tra_dyn-copy center · axial · 3.5mm · 0.83mm/px · 1 of 20 slices shown (2 of 20)]
[im 1/20]
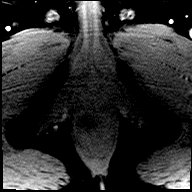

[Series 19: post t1_twist_tra_dyn-copy cent_sub_ttc=(id) · axial · 3.5mm · 0.83mm/px · 1 of 20 slices shown (1 of 18)]
[im 1/20]
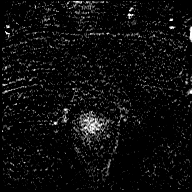

[Series 20: post t1_twist_tra_dyn-copy center · axial · 3.5mm · 0.83mm/px · 1 of 20 slices shown (3 of 20)]
[im 1/20]
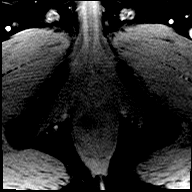

[Series 23: post t1_twist_tra_dyn-copy cent_sub_ttc=(id) · axial · 3.5mm · 0.83mm/px · 1 of 20 slices shown (2 of 18)]
[im 1/20]
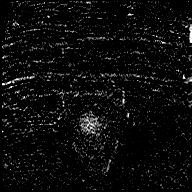

[Series 24: post t1_twist_tra_dyn-copy center · axial · 3.5mm · 0.83mm/px · 1 of 20 slices shown (4 of 20)]
[im 1/20]
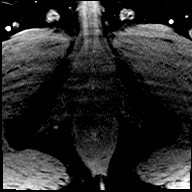

[Series 26: post t1_twist_tra_dyn-copy center · axial · 3.5mm · 0.83mm/px · 1 of 20 slices shown (5 of 20)]
[im 1/20]
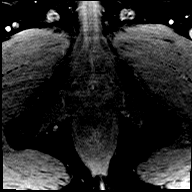

[Series 28: post t1_twist_tra_dyn-copy center · axial · 3.5mm · 0.83mm/px · 1 of 20 slices shown (6 of 20)]
[im 1/20]
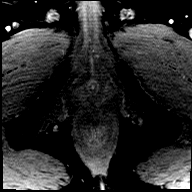

[Series 29: post t1_twist_tra_dyn-copy cent_sub_ttc=(id) · axial · 3.5mm · 0.83mm/px · 1 of 20 slices shown (3 of 18)]
[im 1/20]
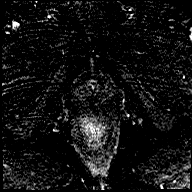

[Series 30: post t1_twist_tra_dyn-copy center · axial · 3.5mm · 0.83mm/px · 1 of 20 slices shown (7 of 20)]
[im 1/20]
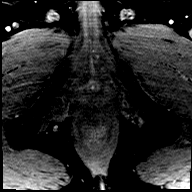

[Series 31: post t1_twist_tra_dyn-copy cent_sub_ttc=(id) · axial · 3.5mm · 0.83mm/px · 1 of 20 slices shown (4 of 18)]
[im 1/20]
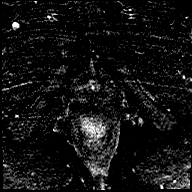

[Series 33: post t1_twist_tra_dyn-copy cent_sub_ttc=(id) · axial · 3.5mm · 0.83mm/px · 1 of 20 slices shown (5 of 18)]
[im 1/20]
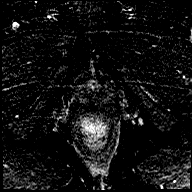

[Series 34: post t1_twist_tra_dyn-copy center · axial · 3.5mm · 0.83mm/px · 1 of 20 slices shown (8 of 20)]
[im 1/20]
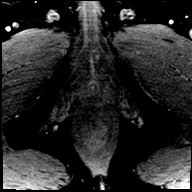

[Series 35: post t1_twist_tra_dyn-copy cent_sub_ttc=(id) · axial · 3.5mm · 0.83mm/px · 1 of 20 slices shown (6 of 18)]
[im 1/20]
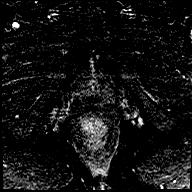

[Series 36: post t1_twist_tra_dyn-copy center · axial · 3.5mm · 0.83mm/px · 1 of 20 slices shown (9 of 20)]
[im 1/20]
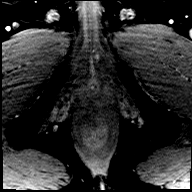

[Series 37: post t1_twist_tra_dyn-copy cent_sub_ttc=(id) · axial · 3.5mm · 0.83mm/px · 1 of 20 slices shown (7 of 18)]
[im 1/20]
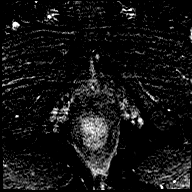

[Series 38: post t1_twist_tra_dyn-copy center · axial · 3.5mm · 0.83mm/px · 1 of 20 slices shown (10 of 20)]
[im 1/20]
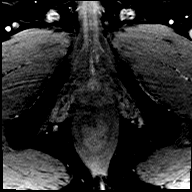

[Series 39: post t1_twist_tra_dyn-copy cent_sub_ttc=(id) · axial · 3.5mm · 0.83mm/px · 1 of 20 slices shown (8 of 18)]
[im 1/20]
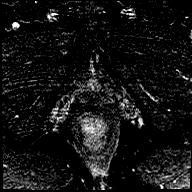

[Series 41: post t1_twist_tra_dyn-copy cent_sub_ttc=(id) · axial · 3.5mm · 0.83mm/px · 1 of 20 slices shown (9 of 18)]
[im 1/20]
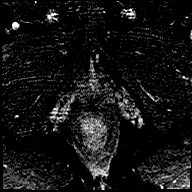

[Series 42: post t1_twist_tra_dyn-copy center · axial · 3.5mm · 0.83mm/px · 1 of 20 slices shown (11 of 20)]
[im 1/20]
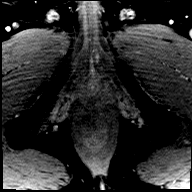

[Series 43: post t1_twist_tra_dyn-copy cent_sub_ttc=(id) · axial · 3.5mm · 0.83mm/px · 1 of 20 slices shown (10 of 18)]
[im 1/20]
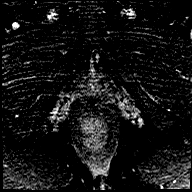

[Series 46: post t1_twist_tra_dyn-copy center · axial · 3.5mm · 0.83mm/px · 1 of 20 slices shown (12 of 20)]
[im 1/20]
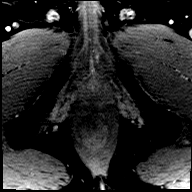

[Series 48: post t1_twist_tra_dyn-copy center · axial · 3.5mm · 0.83mm/px · 1 of 20 slices shown (13 of 20)]
[im 1/20]
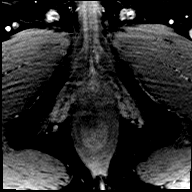

[Series 49: post t1_twist_tra_dyn-copy cent_sub_ttc=(id) · axial · 3.5mm · 0.83mm/px · 1 of 20 slices shown (11 of 18)]
[im 1/20]
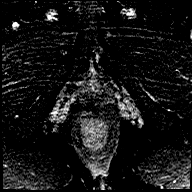

[Series 50: post t1_twist_tra_dyn-copy center · axial · 3.5mm · 0.83mm/px · 1 of 20 slices shown (14 of 20)]
[im 1/20]
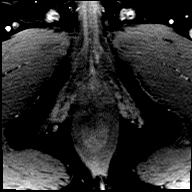

[Series 51: post t1_twist_tra_dyn-copy cent_sub_ttc=(id) · axial · 3.5mm · 0.83mm/px · 1 of 20 slices shown (12 of 18)]
[im 1/20]
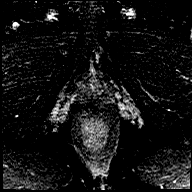

[Series 53: post t1_twist_tra_dyn-copy cent_sub_ttc=(id) · axial · 3.5mm · 0.83mm/px · 1 of 20 slices shown (13 of 18)]
[im 1/20]
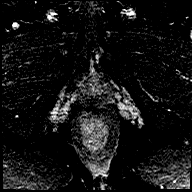

[Series 54: post t1_twist_tra_dyn-copy center · axial · 3.5mm · 0.83mm/px · 1 of 20 slices shown (15 of 20)]
[im 1/20]
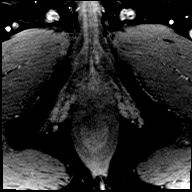

[Series 55: post t1_twist_tra_dyn-copy cent_sub_ttc=(id) · axial · 3.5mm · 0.83mm/px · 1 of 20 slices shown (14 of 18)]
[im 1/20]
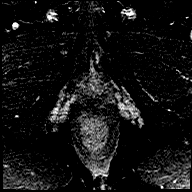

[Series 56: post t1_twist_tra_dyn-copy center · axial · 3.5mm · 0.83mm/px · 1 of 20 slices shown (16 of 20)]
[im 1/20]
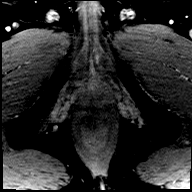

[Series 58: post t1_twist_tra_dyn-copy center · axial · 3.5mm · 0.83mm/px · 1 of 20 slices shown (17 of 20)]
[im 1/20]
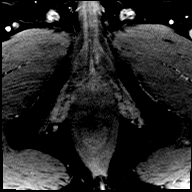

[Series 64: post t1_twist_tra_dyn-copy center · axial · 3.5mm · 0.83mm/px · 1 of 20 slices shown (18 of 20)]
[im 1/20]
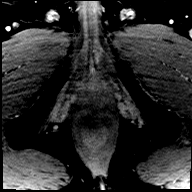

[Series 65: post t1_twist_tra_dyn-copy cent_sub_ttc=(id) · axial · 3.5mm · 0.83mm/px · 1 of 20 slices shown (15 of 18)]
[im 1/20]
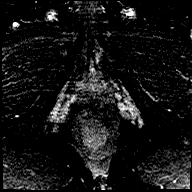

[Series 66: post t1_twist_tra_dyn-copy center · axial · 3.5mm · 0.83mm/px · 1 of 20 slices shown (19 of 20)]
[im 1/20]
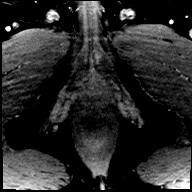

[Series 67: post t1_twist_tra_dyn-copy cent_sub_ttc=(id) · axial · 3.5mm · 0.83mm/px · 1 of 20 slices shown (16 of 18)]
[im 1/20]
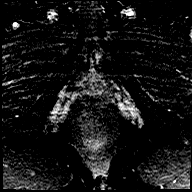

[Series 68: post t1_twist_tra_dyn-copy center · axial · 3.5mm · 0.83mm/px · 1 of 20 slices shown (20 of 20)]
[im 1/20]
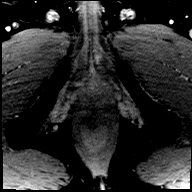

[Series 69: post t1_twist_tra_dyn-copy cent_sub_ttc=(id) · axial · 3.5mm · 0.83mm/px · 1 of 20 slices shown (17 of 18)]
[im 1/20]
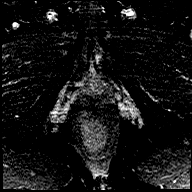

[Series 71: post t1_twist_tra_dyn-copy cent_sub_ttc=(id) · axial · 3.5mm · 0.83mm/px · 1 of 20 slices shown (18 of 18)]
[im 1/20]
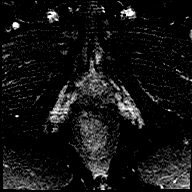

[48 of 48 positions shown; findings below may reference images not displayed]

FINDINGS: Prostate: Demonstrates mild to moderate central gland enlargement
and heterogeneity, consistent with benign prostatic hyperplasia. No
dominant central gland nodule.

Heterogeneous T2 signal throughout the peripheral zone, including on
series 10. Given this underlying mild limitation, no areas of
masslike T2 hypointensity, restricted diffusion. There is relatively
diffuse left greater than right early post-contrast enhancement
throughout the mid gland peripheral zone, including on series 25.

Volume: 5.3 x 3.5 x 3.9 cm (volume = 38 cm^3)

Transcapsular spread:  Absent

Seminal vesicle involvement: Absent

Neurovascular bundle involvement: Absent

Pelvic adenopathy: Absent

Bone metastasis: Absent

Other findings: No significant free fluid.  Normal urinary bladder.
IMPRESSION: 1. No findings of high-grade or macroscopic prostate carcinoma.
2. Combination of relatively diffuse heterogeneous T2 signal and
early post-contrast enhancement throughout the peripheral zone,
nonspecific but can be seen in prostatitis.

## 2022-06-06 ENCOUNTER — Ambulatory Visit (INDEPENDENT_AMBULATORY_CARE_PROVIDER_SITE_OTHER): Payer: Commercial Managed Care - PPO | Admitting: Allergy

## 2022-06-06 ENCOUNTER — Encounter: Payer: Self-pay | Admitting: Allergy

## 2022-06-06 VITALS — BP 122/68 | HR 68 | Resp 16 | Ht 78.0 in | Wt 180.0 lb

## 2022-06-06 DIAGNOSIS — K9049 Malabsorption due to intolerance, not elsewhere classified: Secondary | ICD-10-CM | POA: Diagnosis not present

## 2022-06-06 DIAGNOSIS — Z91038 Other insect allergy status: Secondary | ICD-10-CM

## 2022-06-06 DIAGNOSIS — T781XXD Other adverse food reactions, not elsewhere classified, subsequent encounter: Secondary | ICD-10-CM

## 2022-06-06 DIAGNOSIS — Z713 Dietary counseling and surveillance: Secondary | ICD-10-CM | POA: Diagnosis not present

## 2022-06-06 NOTE — Patient Instructions (Addendum)
Today's skin testing showed: Negative to food panel.   Results given.  Discussed with patient  that skin prick testing and bloodwork (food IgE levels) check for IgE mediated reactions which his clinical presentation does not support.  Keep a food journal with symptoms and foods eaten. Continue to avoid trigger foods. Follow up with GI and nutritionist as scheduled.  Gave handout on UC trigger foods.  Okay to reintroduce foods one by one at home and monitor symptoms.  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 Consider getting bloodwork. For mild symptoms you can take over the counter antihistamines such as Benadryl and monitor symptoms closely. If symptoms worsen or if you have severe symptoms including breathing issues, throat closure, significant swelling, whole body hives, severe diarrhea and vomiting, lightheadedness then inject epinephrine and seek immediate medical care afterwards.  Follow up if needed.

## 2022-06-06 NOTE — Assessment & Plan Note (Signed)
Patient was diagnosed with ulcerative colitis in 2017 and at that time had food testing which was positive to almond, scallops, chicken and borderline positive to soy, flounder, cottonseed and tomato.  Has been avoiding these foods since then and may have noticed some improvement but at the same time he was also receiving treatment for his newly diagnosed ulcerative colitis.  Recently his ulcerative colitis has been flaring and working with a nutritionist.  Concerned if he may have developed some new allergies. Patient never had any anaphylactic reactions to the above foods.  Discussed with patient  that skin prick testing and bloodwork (food IgE levels) check for IgE mediated reactions which his clinical presentation does not support.  Patient would still like to undergo testing due to his history of positive skin testing.  Today's skin testing showed: Negative to food panel.   Keep a food journal with symptoms and foods eaten.  Continue to avoid trigger foods.  Follow up with GI and nutritionist as scheduled.   Gave handout on UC trigger foods.   Okay to reintroduce foods one by one at home and monitor symptoms.

## 2022-06-06 NOTE — Progress Notes (Signed)
New Patient Note  RE: Stephen Ferrell MRN: [id number] DOB: [birth year] Date of Office Visit: 06/06/2022  Consult requested by: [name], MD Primary care provider: [name], MD  Chief Complaint: Food Intolerance  History of Present Illness: I had the pleasure of seeing [name] for initial evaluation at the [facility] on 06/06/2022. He is a 51 y.o. male, who is referred here by [name], MD for the evaluation of food allergies.  Last seen by Dr. [name] in 2017 for food allergies and hymenoptera allergy.  Patient is having issues with his ulcerative colitis. He is followed by GI and was diagnosed in 2017.  Patient never had any anaphylactic reactions to foods.   Past work up includes: 2017 skin testing was positive to almond, scallops, chicken and borderline positive to soy, flounder, cottonseed and tomato.  He is also working with a nutritionist currently.  Dietary History: patient has been eating other foods including limited milk, eggs, peanut, limited treenuts, limited sesame, limited seafood due to his wife's food allergy, wheat, meats, fruits and vegetables.  04/25/2022 GI visit: "51 y.o. male with PMHx of UC on Lialda who is now presenting with increased diarrhea and LLQ which likely are due to activation of his UC. However, since he had a travel hx and he was generally doing well on Lialda, we should also consider and rule out infectious etiologies as well.   We extensively discussed this with him, given his presentation we will order stool test alongside other lab work. However, as we think most likely he has UC activation, we will start him on budesonide as well to help with inflammation.   He will follow up with [place] shortly. For his hernia, he can follow up with his PCP as well for possible surgical repair. "  Assessment and Plan: [name] is a 51 y.o. male with: History of positive food allergy testing Patient was diagnosed with  ulcerative colitis in 2017 and at that time had food testing which was positive to almond, scallops, chicken and borderline positive to soy, flounder, cottonseed and tomato.  Has been avoiding these foods since then and may have noticed some improvement but at the same time he was also receiving treatment for his newly diagnosed ulcerative colitis.  Recently his ulcerative colitis has been flaring and working with a nutritionist.  Concerned if he may have developed some new allergies. Patient never had any anaphylactic reactions to the above foods. Discussed with patient  that skin prick testing and bloodwork (food IgE levels) check for IgE mediated reactions which his clinical presentation does not support. Patient would still like to undergo testing due to his history of positive skin testing. Today's skin testing showed: Negative to food panel.  Keep a food journal with symptoms and foods eaten. Continue to avoid trigger foods. Follow up with GI and nutritionist as scheduled.  Gave handout on UC trigger foods.  Okay to reintroduce foods one by one at home and monitor symptoms.  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  No prior work-up that he is aware of.  He does have epinephrine device if needed.  No prior use. Continue to avoid. Consider getting bloodwork. For mild symptoms you can take over the counter antihistamines such as Benadryl and monitor symptoms closely. If symptoms worsen or if you have severe symptoms including breathing issues, throat closure, significant swelling, whole body hives, severe diarrhea and vomiting, lightheadedness then inject epinephrine and  seek immediate medical care afterwards.  Return if symptoms worsen or fail to improve.  No orders of the defined types were placed in this encounter.  Lab Orders         Tryptase         Allergen Hymenoptera Panel      Other allergy screening: Asthma: no Rhino conjunctivitis:  yes Mild symptoms.  Medication allergy: no Hymenoptera allergy:  Whole arm swelled up after a sting. Has epinephrine.  Urticaria: no Eczema:no History of recurrent infections suggestive of immunodeficency: no  Diagnostics: Skin Testing: Food allergy panel. Negative to food panel. Results discussed with patient/family.  Food Adult Perc - 06/06/22 1500     Time Antigen Placed 1515    Allergen Manufacturer Lavella Hammock    Location Back    Number of allergen test 72     Control-buffer 50% Glycerol Negative    Control-Histamine 1 mg/ml 3+    1. Peanut Negative    2. Soybean Negative    3. Wheat Negative    4. Sesame Negative    5. Milk, cow Negative    6. Egg White, Chicken Negative    7. Casein Negative    8. Shellfish Mix Negative    9. Fish Mix Negative    10. Cashew Negative    11. Pecan Food Negative    12. Blanchard Negative    13. Almond Negative    14. Hazelnut Negative    15. [place] nut Negative    16. Coconut Negative    17. Pistachio Negative    18. Catfish Negative    19. Bass Negative    20. Trout Negative    21. Tuna Negative    22. Salmon Negative    23. Flounder Negative    24. Codfish Negative    25. Shrimp Negative    26. Crab Negative    27. Lobster Negative    28. Oyster Negative    29. Scallops Negative    30. Barley Negative    31. Oat  Negative    32. Rye  Negative    33. Hops Negative    34. Rice Negative    35. Cottonseed Negative    36. Saccharomyces Cerevisiae  Negative    37. Pork Negative    38. [place] Meat Negative    39. Chicken Meat Negative    40. Beef Negative    41. Lamb Negative    42. Tomato Negative    43. White Potato Negative    44. Sweet Potato Negative    45. Pea, Green/English Negative    46. Navy Bean Negative    47. Mushrooms Negative    48. Avocado Negative    49. Onion Negative    50. Cabbage Negative    51. Carrots Negative    52. Celery Negative    53. Corn Negative    54. Cucumber Negative    55.  Grape (White seedless) Negative    56. Orange  Negative    57. Banana Negative    58. Apple Negative    59. Peach Negative    60. Strawberry Negative    61. Cantaloupe Negative    62. Watermelon Negative    63. Pineapple Negative    64. Chocolate/Cacao bean Negative    65. Karaya Gum Negative    66. Acacia (Arabic Gum) Negative    67. Cinnamon Negative    68. Nutmeg Negative    69. Ginger Negative  70. Garlic Negative    71. Pepper, black Negative    72. 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 2 Device 0   Budesonide ER 9 MG TB24 Take 1 tablet by mouth daily.     cholecalciferol (VITAMIN D3) 25 MCG (1000 UNIT) tablet Take 1,000 Units by mouth daily.     ferrous sulfate 324 MG TBEC Take 324 mg by mouth.     mesalamine (LIALDA) 1.2 g EC tablet Take 4.8 g by mouth daily.      Multiple Vitamin (MULTIVITAMIN) tablet Take 1 tablet by mouth daily.     No current facility-administered medications for this visit.   Allergies: Allergies  Allergen Reactions   Other Other (See Comments)    Almonds, chicken, soy, tomato, flounder, scallops, cottonseed. Pt told to avoid these s/p allergy testing.     Social History: Social History   Socioeconomic History   Marital status: Married    Spouse name: Not on file   Number of children: Not on file   Years of education: Not on file   Highest education level: Not on file  Occupational History   Not on file  Tobacco Use   Smoking status: Never    Passive exposure: Never   Smokeless tobacco: Never  Vaping Use   Vaping Use: Never used  Substance and Sexual Activity   Alcohol use: No    Alcohol/week: 4.0 standard drinks of alcohol    Types: 4 Standard drinks or equivalent per week   Drug use: No   Sexual activity: Not on file  Other Topics Concern   Not on file  Social History Narrative   Not on file   Social Determinants of Health   Financial Resource Strain: Not on file  Food Insecurity: Not on file  Transportation Needs: Not on file  Physical Activity: Not on file  Stress: Not on file  Social Connections: Not on file   Lives in a 52 year old house. Smoking: denies Occupation: Product manager HistoryFreight forwarder in the house: no Charity fundraiser in the family room: no Carpet in the bedroom: no Heating: gas Cooling: central Pet: no  Family History: Family History  Problem Relation Age of Onset   Cancer Mother        melanoma, breast cancer   Asthma Father    Hypertension Father    Hyperlipidemia Father    Eczema Father    Mental illness Brother    Review of Systems  Constitutional:  Negative for appetite change, chills, fever and unexpected weight change.  HENT:  Negative for congestion and rhinorrhea.   Eyes:  Negative for itching.  Respiratory:  Negative for cough, chest tightness, shortness of breath and wheezing.   Cardiovascular:  Negative for chest pain.  Gastrointestinal:  Positive for abdominal pain and diarrhea. Negative for blood in stool and constipation.  Genitourinary:  Negative for difficulty urinating.  Skin:  Negative for rash.  Allergic/Immunologic: Negative for food allergies.   Neurological:  Negative for headaches.    Objective: BP 122/68   Pulse 68   Resp 16   Ht '6\' 6"'$  (1.981 m)   Wt 180 lb (81.6 kg)   SpO2 98%   BMI 20.80 kg/m  Body mass index is 20.8 kg/m. Physical Exam Vitals and nursing note reviewed.  Constitutional:      Appearance: Normal appearance. He is well-developed.  HENT:     Head: Normocephalic and atraumatic.     Right Ear: Tympanic membrane and external ear normal.     Left Ear: Tympanic membrane and external ear normal.     Nose: Nose normal.     Mouth/Throat:     Mouth: Mucous membranes are moist.     Pharynx: Oropharynx is clear.  Eyes:     Conjunctiva/sclera: Conjunctivae normal.  Cardiovascular:     Rate and Rhythm: Normal rate and regular rhythm.     Heart sounds: Normal heart sounds. No murmur heard.    No friction rub. No gallop.  Pulmonary:     Effort: Pulmonary effort is normal.     Breath sounds: Normal breath sounds. No wheezing, rhonchi or rales.  Musculoskeletal:     Cervical back: Neck supple.  Skin:    General: Skin is warm.     Findings: No rash.  Neurological:     Mental Status: He is alert and oriented to person, place, and time.  Psychiatric:        Behavior: Behavior normal.   The plan was reviewed with the patient/family, and all questions/concerned were addressed.  It was my pleasure to see Lavel today and participate in his care. Please feel free to contact me with any questions or concerns.  Sincerely,  Rexene Alberts, DO Allergy & Immunology  [facility] office: [place] office: [phone]

## 2022-06-06 NOTE — Assessment & Plan Note (Signed)
Patient's arm swelled up after getting stung as a child.  No prior work-up that he is aware of.  He does have epinephrine device if needed.  No prior use. . Continue to avoid. . Consider getting bloodwork. . For mild symptoms you can take over the counter antihistamines such as Benadryl and monitor symptoms closely. If symptoms worsen or if you have severe symptoms including breathing issues, throat closure, significant swelling, whole body hives, severe diarrhea and vomiting, lightheadedness then inject epinephrine and seek immediate medical care afterwards.

## 2022-07-22 ENCOUNTER — Other Ambulatory Visit: Payer: Self-pay | Admitting: Surgery

## 2022-07-26 ENCOUNTER — Other Ambulatory Visit: Payer: Self-pay

## 2022-07-26 ENCOUNTER — Encounter (HOSPITAL_BASED_OUTPATIENT_CLINIC_OR_DEPARTMENT_OTHER): Payer: Self-pay | Admitting: Surgery

## 2022-07-29 NOTE — Progress Notes (Signed)
       Patient Instructions  The night before surgery:  No food after midnight. ONLY clear liquids after midnight  The day of surgery (if you do NOT have diabetes):  Drink ONE (1) Pre-Surgery Clear Ensure as directed.   This drink was given to you during your hospital  pre-op appointment visit. The pre-op nurse will instruct you on the time to drink the  Pre-Surgery Ensure depending on your surgery time. Finish the drink at the designated time by the pre-op nurse.  Nothing else to drink after completing the  Pre-Surgery Clear Ensure.  The day of surgery (if you have diabetes): Drink ONE (1) Gatorade 2 (G2) as directed. This drink was given to you during your hospital  pre-op appointment visit.  The pre-op nurse will instruct you on the time to drink the   Gatorade 2 (G2) depending on your surgery time. Color of the Gatorade may vary. Red is not allowed. Nothing else to drink after completing the  Gatorade 2 (G2).         If you have questions, please contact your surgeon's office.Patient was provided with CHG cleanser to use at home before the procedure. Patient verbalized understanding of instructions.Patient was provided with CHG cleanser to use at home before the procedure. Patient verbalized understanding of instructions.

## 2022-07-31 NOTE — H&P (Signed)
  REFERRING PHYSICIAN: [name], MD  PROVIDER: Beverlee Nims, MD  MRN: [id number] DOB: 1971-05-29 DATE OF ENCOUNTER: 07/22/2022 Subjective  Chief Complaint: New Consultation and Inguinal Hernia   History of Present Illness: Stephen Ferrell is a 51 y.o. male who is seen today as an office consultation for evaluation of New Consultation and Inguinal Hernia .  This is a pleasant 51 year old gentleman with a history of ulcerative colitis who is referred to me for evaluation of a symptomatic left inguinal hernia. The patient reports that earlier this summer he was traveling and lifted heavy piece of luggage and then developed discomfort in the groin on the left side. He has since noticed an easily reducible bulge. He has not noticed a similar issue on the left side. He has had no nausea or vomiting. He has had some recent flareup of his ulcerative colitis and has had some mild anemia which he is treating with iron through his gastroenterologist.  Review of Systems: A complete review of systems was obtained from the patient. I have reviewed this information and discussed as appropriate with the patient. See HPI as well for other ROS.  ROS  Medical History: Past Medical History: Diagnosis Date Anemia Arthritis  There is no problem list on file for this patient.  Past Surgical History: Procedure Laterality Date knee surgery   No Known Allergies  Current Outpatient Medications on File Prior to Visit Medication Sig Dispense Refill cholecalciferol (VITAMIN D3) 5,000 unit capsule Take by mouth mesalamine (LIALDA) 1.2 gram EC tablet Mesalamine  No current facility-administered medications on file prior to visit.  Family History Problem Relation Age of Onset Skin cancer Mother Breast cancer Mother   Social History  Tobacco Use Smoking Status Former Types: Cigarettes Smokeless Tobacco Never   Social History  Socioeconomic History Marital status:  Married Tobacco Use Smoking status: Former Types: Cigarettes Smokeless tobacco: Never Substance and Sexual Activity Alcohol use: Not Currently Drug use: Never  Objective:  Vitals: 07/22/22 1515 BP: 120/80 Pulse: 87 Temp: 37.2 C (99 F) SpO2: 98% Weight: 83 kg (183 lb) Height: 198.1 cm ('6\' 6"'$ )  Body mass index is 21.15 kg/m.  Physical Exam  He appears well on exam today  His abdomen is soft. He has a moderate sized, reducible left inguinal hernia. His right inguinal area is feels very weak like he is developing a hernia there as well.  Labs, Imaging and Diagnostic Testing: Reviewed his notes from his primary care provider and in the electronic medical records  Assessment and Plan:  Diagnoses and all orders for this visit:  Left inguinal hernia    At this point, I had a discussion with the patient regarding hernias and abdominal wall anatomy. He is quite symptomatic from the hernia so repair is recommended. We discussed both the laparoscopic and open techniques as well as use of mesh. As I believe he may be developing a right inguinal hernia I would recommend a laparoscopic repair so I could evaluate both inguinal areas and repair of the right side if this is needed as well. I explained the surgical procedure in detail. We discussed the risks which includes but is not limited to bleeding, infection, injury to surrounding structures, chronic pain postoperatively, use of mesh, hernia recurrence, cardiopulmonary issues, postoperative recovery, etc. He understands and wishes to proceed with surgery which will be scheduled

## 2022-08-01 ENCOUNTER — Encounter (HOSPITAL_BASED_OUTPATIENT_CLINIC_OR_DEPARTMENT_OTHER)
Admission: RE | Disposition: A | Discharge status: Home / Self Care | Payer: Self-pay | Source: Home / Self Care | Attending: Surgery

## 2022-08-01 ENCOUNTER — Other Ambulatory Visit: Payer: Self-pay

## 2022-08-01 ENCOUNTER — Ambulatory Visit (HOSPITAL_BASED_OUTPATIENT_CLINIC_OR_DEPARTMENT_OTHER): Payer: Commercial Managed Care - PPO | Admitting: Anesthesiology

## 2022-08-01 ENCOUNTER — Encounter (HOSPITAL_BASED_OUTPATIENT_CLINIC_OR_DEPARTMENT_OTHER): Payer: Self-pay | Admitting: Surgery

## 2022-08-01 ENCOUNTER — Ambulatory Visit (HOSPITAL_BASED_OUTPATIENT_CLINIC_OR_DEPARTMENT_OTHER)
Admission: RE | Admit: 2022-08-01 | Discharge: 2022-08-01 | Disposition: A | Discharge status: Home / Self Care | Payer: Commercial Managed Care - PPO | Attending: Surgery | Admitting: Surgery

## 2022-08-01 DIAGNOSIS — K4090 Unilateral inguinal hernia, without obstruction or gangrene, not specified as recurrent: Secondary | ICD-10-CM

## 2022-08-01 DIAGNOSIS — Z87891 Personal history of nicotine dependence: Secondary | ICD-10-CM | POA: Diagnosis not present

## 2022-08-01 DIAGNOSIS — Z01818 Encounter for other preprocedural examination: Secondary | ICD-10-CM

## 2022-08-01 DIAGNOSIS — K5190 Ulcerative colitis, unspecified, without complications: Secondary | ICD-10-CM | POA: Diagnosis not present

## 2022-08-01 HISTORY — PX: INGUINAL HERNIA REPAIR: SHX194

## 2022-08-01 HISTORY — DX: Unilateral inguinal hernia, without obstruction or gangrene, not specified as recurrent: K40.90

## 2022-08-01 SURGERY — REPAIR, HERNIA, INGUINAL, LAPAROSCOPIC
Anesthesia: General | Site: Abdomen | Laterality: Left

## 2022-08-01 MED ORDER — AMISULPRIDE (ANTIEMETIC) 5 MG/2ML IV SOLN: 10.0000 mg | Freq: Once | INTRAVENOUS | Status: DC | PRN

## 2022-08-01 MED ORDER — HYDROMORPHONE HCL 1 MG/ML IJ SOLN
INTRAMUSCULAR | Status: AC
  Filled 2022-08-01: qty 0.5

## 2022-08-01 MED ORDER — CHLORHEXIDINE GLUCONATE CLOTH 2 % EX PADS: 6.0000 | MEDICATED_PAD | Freq: Once | CUTANEOUS | Status: DC

## 2022-08-01 MED ORDER — ACETAMINOPHEN 10 MG/ML IV SOLN
INTRAVENOUS | Status: DC | PRN
  Administered 2022-08-01: 1000 mg via INTRAVENOUS

## 2022-08-01 MED ORDER — ACETAMINOPHEN 500 MG PO TABS
ORAL_TABLET | ORAL | Status: AC
  Filled 2022-08-01: qty 2

## 2022-08-01 MED ORDER — ONDANSETRON HCL 4 MG/2ML IJ SOLN
INTRAMUSCULAR | Status: DC | PRN
  Administered 2022-08-01: 4 mg via INTRAVENOUS

## 2022-08-01 MED ORDER — ONDANSETRON HCL 4 MG/2ML IJ SOLN: 4.0000 mg | Freq: Once | INTRAMUSCULAR | Status: DC | PRN

## 2022-08-01 MED ORDER — LACTATED RINGERS IV SOLN: INTRAVENOUS | Status: DC

## 2022-08-01 MED ORDER — ROCURONIUM BROMIDE 100 MG/10ML IV SOLN
INTRAVENOUS | Status: DC | PRN
  Administered 2022-08-01: 30 mg via INTRAVENOUS
  Administered 2022-08-01: 10 mg via INTRAVENOUS

## 2022-08-01 MED ORDER — MIDAZOLAM HCL 2 MG/2ML IJ SOLN
INTRAMUSCULAR | Status: AC
  Filled 2022-08-01: qty 2

## 2022-08-01 MED ORDER — CEFAZOLIN SODIUM-DEXTROSE 2-4 GM/100ML-% IV SOLN
INTRAVENOUS | Status: AC
  Filled 2022-08-01: qty 100

## 2022-08-01 MED ORDER — ACETAMINOPHEN 500 MG PO TABS: 1000.0000 mg | ORAL_TABLET | ORAL | Status: DC

## 2022-08-01 MED ORDER — ACETAMINOPHEN 10 MG/ML IV SOLN
INTRAVENOUS | Status: AC
  Filled 2022-08-01: qty 100

## 2022-08-01 MED ORDER — KETOROLAC TROMETHAMINE 30 MG/ML IJ SOLN: 30.0000 mg | Freq: Once | INTRAMUSCULAR | Status: DC | PRN

## 2022-08-01 MED ORDER — FENTANYL CITRATE (PF) 100 MCG/2ML IJ SOLN
INTRAMUSCULAR | Status: AC
  Filled 2022-08-01: qty 2

## 2022-08-01 MED ORDER — SUCCINYLCHOLINE CHLORIDE 200 MG/10ML IV SOSY
PREFILLED_SYRINGE | INTRAVENOUS | Status: DC | PRN
  Administered 2022-08-01: 120 mg via INTRAVENOUS

## 2022-08-01 MED ORDER — SUGAMMADEX SODIUM 200 MG/2ML IV SOLN
INTRAVENOUS | Status: DC | PRN
  Administered 2022-08-01: 332.8 mg via INTRAVENOUS

## 2022-08-01 MED ORDER — DEXAMETHASONE SODIUM PHOSPHATE 4 MG/ML IJ SOLN
INTRAMUSCULAR | Status: DC | PRN
  Administered 2022-08-01: 10 mg via INTRAVENOUS

## 2022-08-01 MED ORDER — CEFAZOLIN SODIUM-DEXTROSE 2-4 GM/100ML-% IV SOLN
2.0000 g | INTRAVENOUS | Status: AC
  Administered 2022-08-01: 2 g via INTRAVENOUS

## 2022-08-01 MED ORDER — BUPIVACAINE-EPINEPHRINE (PF) 0.5% -1:200000 IJ SOLN
INTRAMUSCULAR | Status: DC | PRN
  Administered 2022-08-01: 15 mL

## 2022-08-01 MED ORDER — OXYCODONE HCL 5 MG PO TABS: 5.0000 mg | ORAL_TABLET | Freq: Four times a day (QID) | ORAL | 0 refills | Status: DC | PRN

## 2022-08-01 MED ORDER — ENSURE PRE-SURGERY PO LIQD: 296.0000 mL | Freq: Once | ORAL | Status: DC

## 2022-08-01 MED ORDER — FENTANYL CITRATE (PF) 100 MCG/2ML IJ SOLN
INTRAMUSCULAR | Status: DC | PRN
  Administered 2022-08-01: 100 ug via INTRAVENOUS

## 2022-08-01 MED ORDER — PROPOFOL 10 MG/ML IV BOLUS
INTRAVENOUS | Status: DC | PRN
  Administered 2022-08-01: 200 mg via INTRAVENOUS

## 2022-08-01 MED ORDER — MIDAZOLAM HCL 5 MG/5ML IJ SOLN
INTRAMUSCULAR | Status: DC | PRN
  Administered 2022-08-01: 2 mg via INTRAVENOUS

## 2022-08-01 MED ORDER — OXYCODONE HCL 5 MG PO TABS: 5.0000 mg | ORAL_TABLET | Freq: Once | ORAL | Status: DC | PRN

## 2022-08-01 MED ORDER — OXYCODONE HCL 5 MG/5ML PO SOLN: 5.0000 mg | Freq: Once | ORAL | Status: DC | PRN

## 2022-08-01 MED ORDER — ACETAMINOPHEN 500 MG PO TABS: 1000.0000 mg | ORAL_TABLET | Freq: Once | ORAL | Status: DC

## 2022-08-01 MED ORDER — LIDOCAINE HCL (CARDIAC) PF 100 MG/5ML IV SOSY
PREFILLED_SYRINGE | INTRAVENOUS | Status: DC | PRN
  Administered 2022-08-01: 100 mg via INTRAVENOUS

## 2022-08-01 MED ORDER — HYDROMORPHONE HCL 1 MG/ML IJ SOLN
0.2500 mg | INTRAMUSCULAR | Status: DC | PRN
  Administered 2022-08-01 (×2): 0.5 mg via INTRAVENOUS

## 2022-08-01 SURGICAL SUPPLY — 34 items
ADH SKN CLS APL DERMABOND .7 (GAUZE/BANDAGES/DRESSINGS) ×2
APL PRP STRL LF DISP 70% ISPRP (MISCELLANEOUS) ×1
APPLIER CLIP LOGIC TI 5 (MISCELLANEOUS) IMPLANT
APR CLP MED LRG 33X5 (MISCELLANEOUS)
BLADE CLIPPER SURG (BLADE) IMPLANT
CHLORAPREP W/TINT 26 (MISCELLANEOUS) ×1 IMPLANT
DERMABOND ADVANCED .7 DNX12 (GAUZE/BANDAGES/DRESSINGS) ×2 IMPLANT
DEVICE SECURE STRAP 25 ABSORB (INSTRUMENTS) ×1 IMPLANT
DISSECT BALLN SPACEMKR + OVL (BALLOONS) ×1
DISSECTOR BALLN SPACEMKR + OVL (BALLOONS) ×1 IMPLANT
DISSECTOR BLUNT TIP ENDO 5MM (MISCELLANEOUS) IMPLANT
ELECT REM PT RETURN 9FT ADLT (ELECTROSURGICAL) ×1
ELECTRODE REM PT RTRN 9FT ADLT (ELECTROSURGICAL) ×1 IMPLANT
GAUZE 4X4 16PLY ~~LOC~~+RFID DBL (SPONGE) ×1 IMPLANT
GLOVE SURG SIGNA 7.5 PF LTX (GLOVE) ×1 IMPLANT
GOWN STRL REUS W/ TWL LRG LVL3 (GOWN DISPOSABLE) ×1 IMPLANT
GOWN STRL REUS W/ TWL XL LVL3 (GOWN DISPOSABLE) ×1 IMPLANT
GOWN STRL REUS W/TWL LRG LVL3 (GOWN DISPOSABLE) ×1
GOWN STRL REUS W/TWL XL LVL3 (GOWN DISPOSABLE) ×1
IRRIG SUCT STRYKERFLOW 2 WTIP (MISCELLANEOUS)
IRRIGATION SUCT STRKRFLW 2 WTP (MISCELLANEOUS) IMPLANT
NDL INSUFFLATION 14GA 120MM (NEEDLE) IMPLANT
NEEDLE INSUFFLATION 14GA 120MM (NEEDLE) IMPLANT
PACK BASIN DAY SURGERY FS (CUSTOM PROCEDURE TRAY) ×1 IMPLANT
SCISSORS LAP 5X35 DISP (ENDOMECHANICALS) IMPLANT
SET TROCAR LAP APPLE-HUNT 5MM (ENDOMECHANICALS) ×1 IMPLANT
SET TUBE SMOKE EVAC HIGH FLOW (TUBING) ×1 IMPLANT
SLEEVE SCD COMPRESS KNEE MED (STOCKING) ×1 IMPLANT
SUT MNCRL AB 4-0 PS2 18 (SUTURE) ×1 IMPLANT
TOWEL GREEN STERILE FF (TOWEL DISPOSABLE) ×1 IMPLANT
TRAY FOL W/BAG SLVR 16FR STRL (SET/KITS/TRAYS/PACK) IMPLANT
TRAY FOLEY W/BAG SLVR 14FR LF (SET/KITS/TRAYS/PACK) IMPLANT
TRAY FOLEY W/BAG SLVR 16FR LF (SET/KITS/TRAYS/PACK)
TRAY LAPAROSCOPIC (CUSTOM PROCEDURE TRAY) ×1 IMPLANT

## 2022-08-01 NOTE — Transfer of Care (Signed)
Immediate Anesthesia Transfer of Care Note  Patient: Stephen Ferrell  Procedure(s) Performed: LAPAROSCOPIC LEFT INGUINAL HERNIA REPAIR WITH MESH (Left: Abdomen)  Patient Location: PACU  Anesthesia Type:General  Level of Consciousness: drowsy  Airway & Oxygen Therapy: Patient Spontanous Breathing and Patient connected to face mask oxygen  Post-op Assessment: Report given to RN and Post -op Vital signs reviewed and stable  Post vital signs: Reviewed and stable  Last Vitals:  Vitals Value Taken Time  BP 142/83   Temp    Pulse 65 08/01/22 1523  Resp 17 08/01/22 1523  SpO2 100 % 08/01/22 1523  Vitals shown include unvalidated device data.  Last Pain:  Vitals:   08/01/22 1205  TempSrc: Oral  PainSc: 0-No pain      Patients Stated Pain Goal: 5 (41/44/36 0165)  Complications: No notable events documented.

## 2022-08-01 NOTE — Anesthesia Preprocedure Evaluation (Addendum)
Anesthesia Evaluation  Patient identified by MRN, date of birth, ID band Patient awake    Reviewed: Allergy & Precautions, NPO status , Patient's Chart, lab work & pertinent test results  Airway Mallampati: II  TM Distance: >3 FB Neck ROM: Full    Dental no notable dental hx.    Pulmonary neg pulmonary ROS   Pulmonary exam normal breath sounds clear to auscultation       Cardiovascular negative cardio ROS Normal cardiovascular exam Rhythm:Regular Rate:Normal     Neuro/Psych  Headaches  negative psych ROS   GI/Hepatic Neg liver ROS,,,Ulcerative colitis    Endo/Other  negative endocrine ROS    Renal/GU negative Renal ROS  negative genitourinary   Musculoskeletal negative musculoskeletal ROS (+)    Abdominal   Peds  Hematology negative hematology ROS (+)   Anesthesia Other Findings   Reproductive/Obstetrics negative OB ROS                             Anesthesia Physical Anesthesia Plan  ASA: 1  Anesthesia Plan: General   Post-op Pain Management: Tylenol PO (pre-op)* and Toradol IV (intra-op)*   Induction: Intravenous  PONV Risk Score and Plan: 3 and Ondansetron, Dexamethasone, Midazolam and Treatment may vary due to age or medical condition  Airway Management Planned: Oral ETT  Additional Equipment: None  Intra-op Plan:   Post-operative Plan: Extubation in OR  Informed Consent: I have reviewed the patients History and Physical, chart, labs and discussed the procedure including the risks, benefits and alternatives for the proposed anesthesia with the patient or authorized representative who has indicated his/her understanding and acceptance.     Dental advisory given  Plan Discussed with: CRNA  Anesthesia Plan Comments:        Anesthesia Quick Evaluation

## 2022-08-01 NOTE — Anesthesia Postprocedure Evaluation (Signed)
Anesthesia Post Note  Patient: Stephen Ferrell  Procedure(s) Performed: LAPAROSCOPIC LEFT INGUINAL HERNIA REPAIR WITH MESH (Left: Abdomen)     Patient location during evaluation: PACU Anesthesia Type: General Level of consciousness: awake and alert, oriented and patient cooperative Pain management: pain level controlled Vital Signs Assessment: post-procedure vital signs reviewed and stable Respiratory status: spontaneous breathing, nonlabored ventilation and respiratory function stable Cardiovascular status: blood pressure returned to baseline and stable Postop Assessment: no apparent nausea or vomiting Anesthetic complications: no   No notable events documented.  Last Vitals:  Vitals:   08/01/22 1523 08/01/22 1530  BP: (!) 142/83 132/84  Pulse: 71 62  Resp: 18 16  Temp: 36.4 C   SpO2: 100% 100%    Last Pain:  Vitals:   08/01/22 1530  TempSrc:   PainSc: [name]

## 2022-08-01 NOTE — Op Note (Signed)
LAPAROSCOPIC LEFT INGUINAL HERNIA REPAIR WITH MESH  Procedure Note  Stephen Ferrell 08/01/2022   Pre-op Diagnosis: LEFT INGUINAL HERNIA     Post-op Diagnosis: LEFT INGUINAL HERNIA  Procedure(s): LAPAROSCOPIC LEFT INGUINAL HERNIA REPAIR WITH MESH  Surgeon(s): Coralie Keens, MD  Anesthesia: General  Staff:  Circulator: Izora Ribas, RN Relief Circulator: Maurene Capes, RN Relief Scrub: Murvin Natal Scrub Person: Lorenza Burton, CST; Anson Crofts, RN  Estimated Blood Loss: Minimal               Findings: The patient was found to have an indirect left inguinal hernia is a very small direct inguinal hernia.  There was no evidence of right inguinal hernia.  The hernia was repaired with a piece of large Prolene 3D max mesh from [employer]  Procedure: The patient was brought to the operating room and identified the correct patient.  He was placed upon on the operating room table and general anesthesia was induced.  His abdomen was then prepped and draped in the usual sterile fashion.  Using #15 blade I made a small incision below the umbilicus with a scalpel.  I carried this down to the fascia which was opened just slightly to the left of the midline.  The rectus muscle was then identified and elevated.  I passed the dissecting balloon underneath the rectus muscle and manipulated it toward the pubis.  The dissecting balloon was then insufflated under direct vision dissecting out the preperitoneal space.  I then removed the balloon and insufflation was begun with carbon dioxide.  Two 5 mm trocars were placed in the patient's lower midline both under direct vision.  I dissected out the left inguinal area first.  He had a moderate-sized indirect hernia sac which easily separated from the cord structures and was reduced.  He also had a small direct hernia defect.  I then evaluated the right inguinal area and saw no evidence of direct or indirect right inguinal hernia.  Next a piece of  large Prolene 3D max mesh was brought to the field.  I placed it through the umbilical trocar and opened as an onlay on the left inguinal floor.  Using the absorbable tacker I then tacked the mesh to Cooper's ligament, the medial abdominal wall, and slightly laterally.  Wide coverage of the cord structures and direct defect appeared to be achieved.  Hemostasis.  To be achieved.  All ports were then removed under direct vision as we watch the preperitoneal space close and the mesh lie in place.  I then remove the umbilical trocar.  I closed the fascia at the umbilicus with a figure-of-eight 0 Vicryl suture.  All incisions were then anesthetized with Marcaine and closed with 4-0 Monocryl sutures.  I then performed a left ilioinguinal nerve block with 5 cc of Marcaine.  Dermabond was then applied.  The patient tolerated the procedure well.  All the counts were correct at the end of the procedure.  The patient was then extubated in the operating room and taken in stable condition to the recovery room.          Coralie Keens   Date: 08/01/2022  Time: 3:16 PM

## 2022-08-01 NOTE — Anesthesia Procedure Notes (Signed)
Procedure Name: Intubation Date/Time: 08/01/2022 2:36 PM  Performed by: Ezequiel Kayser, CRNAPre-anesthesia Checklist: Patient identified, Emergency Drugs available, Suction available and Patient being monitored Patient Re-evaluated:Patient Re-evaluated prior to induction Oxygen Delivery Method: Circle System Utilized Preoxygenation: Pre-oxygenation with 100% oxygen Induction Type: IV induction Ventilation: Mask ventilation without difficulty Laryngoscope Size: Mac and 4 Grade View: Grade II Tube type: Oral Tube size: 7.0 mm Number of attempts: 1 Airway Equipment and Method: Stylet and Oral airway Placement Confirmation: ETT inserted through vocal cords under direct vision, positive ETCO2 and breath sounds checked- equal and bilateral Secured at: 26 cm Tube secured with: Tape Dental Injury: Teeth and Oropharynx as per pre-operative assessment

## 2022-08-01 NOTE — Discharge Instructions (Addendum)
Post Anesthesia Home Care Instructions  Activity: Get plenty of rest for the remainder of the day. A responsible individual must stay with you for 24 hours following the procedure.  For the next 24 hours, DO NOT: -Drive a car -Paediatric nurse -Drink alcoholic beverages -Take any medication unless instructed by your physician -Make any legal decisions or sign important papers.  Meals: Start with liquid foods such as gelatin or soup. Progress to regular foods as tolerated. Avoid greasy, spicy, heavy foods. If nausea and/or vomiting occur, drink only clear liquids until the nausea and/or vomiting subsides. Call your physician if vomiting continues.  Special Instructions/Symptoms: Your throat may feel dry or sore from the anesthesia or the breathing tube placed in your throat during surgery. If this causes discomfort, gargle with warm salt water. The discomfort should disappear within 24 hours.  If you had a scopolamine patch placed behind your ear for the management of post- operative nausea and/or vomiting:  1. The medication in the patch is effective for 72 hours, after which it should be removed.  Wrap patch in a tissue and discard in the trash. Wash hands thoroughly with soap and water. 2. You may remove the patch earlier than 72 hours if you experience unpleasant side effects which may include dry mouth, dizziness or visual disturbances. 3. Avoid touching the patch. Wash your hands with soap and water after contact with the patch.    May take tylenol at [place] _______[facility]  UMBILICAL OR INGUINAL HERNIA REPAIR: POST OP INSTRUCTIONS  Always review your discharge instruction sheet given to you by the facility where your surgery was performed. IF YOU HAVE DISABILITY OR FAMILY LEAVE FORMS, YOU MUST BRING THEM TO THE OFFICE FOR PROCESSING.   DO NOT GIVE THEM TO YOUR DOCTOR.  1. A  prescription for pain medication may be given to you upon discharge.  Take your  pain medication as prescribed, if needed.  If narcotic pain medicine is not needed, then you may take acetaminophen (Tylenol) or ibuprofen (Advil) as needed. 2. Take your usually prescribed medications unless otherwise directed. If you need a refill on your pain medication, please contact your pharmacy.  They will contact our office to request authorization. Prescriptions will not be filled after 5 pm or on week-ends. 3. You should follow a light diet the first 24 hours after arrival home, such as soup and crackers, etc.  Be sure to include lots of fluids daily.  Resume your normal diet the day after surgery. 4.Most patients will experience some swelling and bruising around the umbilicus or in the groin and scrotum.  Ice packs and reclining will help.  Swelling and bruising can take several days to resolve.  6. It is common to experience some constipation if taking pain medication after surgery.  Increasing fluid intake and taking a stool softener (such as Colace) will usually help or prevent this problem from occurring.  A mild laxative (Milk of Magnesia or Miralax) should be taken according to package directions if there are no bowel movements after 48 hours. 7. Unless discharge instructions indicate otherwise, you may remove your bandages 24-48 hours after surgery, and you may shower at that time.  You may have steri-strips (small skin tapes) in place directly over the incision.  These strips should be left on the skin for 7-10 days.  If your surgeon used skin glue on the incision, you may shower in 24 hours.  The glue will flake off over the  next 2-3 weeks.  Any sutures or staples will be removed at the office during your follow-up visit. 8. ACTIVITIES:  You may resume regular (light) daily activities beginning the next day--such as daily self-care, walking, climbing stairs--gradually increasing activities as tolerated.  You may have sexual intercourse when it is comfortable.  Refrain from any heavy  lifting or straining until approved by your doctor.  a.You may drive when you are no longer taking prescription pain medication, you can comfortably wear a seatbelt, and you can safely maneuver your car and apply brakes. b.RETURN TO WORK:   _____________________________________________  9.You should see your doctor in the office for a follow-up appointment approximately 2-3 weeks after your surgery.  Make sure that you call for this appointment within a day or two after you arrive home to insure a convenient appointment time. 10.OTHER INSTRUCTIONS: _NO LIFTING MORE THAN 15 TO 20 POUNDS FOR 4 WEEKS ICE PACK, TYLENOL, AND IBUPROFEN ALSO FOR PAIN OK TO SHOWER STARTING TOMORROW________________________    _____________________________________  WHEN TO CALL YOUR DOCTOR: Fever over 101.0 Inability to urinate Nausea and/or vomiting Extreme swelling or bruising Continued bleeding from incision. Increased pain, redness, or drainage from the incision  The clinic staff is available to answer your questions during regular business hours.  Please don't hesitate to call and ask to speak to one of the nurses for clinical concerns.  If you have a medical emergency, go to the nearest emergency room or call 911.  A surgeon from [facility] is always on call at the hospital   [address], [place], [place], [place]  [address] ?  [address], [place], [place]   [id number] [phone] ? [phone] ? FAX [phone] Web site: [email or web address]

## 2022-08-01 NOTE — Interval H&P Note (Signed)
History and Physical Interval Note:no change in H and P  08/01/2022 12:54 PM  Stephen Ferrell  has presented today for surgery, with the diagnosis of LEFT INGUINAL HERNIA.  The various methods of treatment have been discussed with the patient and family. After consideration of risks, benefits and other options for treatment, the patient has consented to  Procedure(s): LAPAROSCOPIC LEFT INGUINAL HERNIA REPAIR WITH MESH AND POSSIBLE RIGHT (Left) as a surgical intervention.  The patient's history has been reviewed, patient examined, no change in status, stable for surgery.  I have reviewed the patient's chart and labs.  Questions were answered to the patient's satisfaction.     Coralie Keens

## 2022-08-02 ENCOUNTER — Encounter (HOSPITAL_BASED_OUTPATIENT_CLINIC_OR_DEPARTMENT_OTHER): Payer: Self-pay | Admitting: Surgery

## 2022-08-02 NOTE — Progress Notes (Signed)
Left message stating courtesy call and if any questions or concerns please call the doctors office.  

## 2023-11-27 DIAGNOSIS — Z13228 Encounter for screening for other metabolic disorders: Secondary | ICD-10-CM | POA: Diagnosis not present

## 2023-11-27 DIAGNOSIS — E559 Vitamin D deficiency, unspecified: Secondary | ICD-10-CM | POA: Diagnosis not present

## 2023-11-27 DIAGNOSIS — Z13220 Encounter for screening for lipoid disorders: Secondary | ICD-10-CM | POA: Diagnosis not present

## 2023-11-27 DIAGNOSIS — K5190 Ulcerative colitis, unspecified, without complications: Secondary | ICD-10-CM | POA: Diagnosis not present

## 2023-12-03 DIAGNOSIS — M9905 Segmental and somatic dysfunction of pelvic region: Secondary | ICD-10-CM | POA: Diagnosis not present

## 2023-12-03 DIAGNOSIS — M9904 Segmental and somatic dysfunction of sacral region: Secondary | ICD-10-CM | POA: Diagnosis not present

## 2023-12-03 DIAGNOSIS — M9903 Segmental and somatic dysfunction of lumbar region: Secondary | ICD-10-CM | POA: Diagnosis not present

## 2023-12-03 DIAGNOSIS — M9901 Segmental and somatic dysfunction of cervical region: Secondary | ICD-10-CM | POA: Diagnosis not present

## 2023-12-30 DIAGNOSIS — N402 Nodular prostate without lower urinary tract symptoms: Secondary | ICD-10-CM | POA: Diagnosis not present

## 2023-12-30 DIAGNOSIS — R972 Elevated prostate specific antigen [PSA]: Secondary | ICD-10-CM | POA: Diagnosis not present

## 2023-12-30 DIAGNOSIS — N400 Enlarged prostate without lower urinary tract symptoms: Secondary | ICD-10-CM | POA: Diagnosis not present

## 2024-01-02 ENCOUNTER — Other Ambulatory Visit: Payer: Self-pay | Admitting: Urology

## 2024-01-02 ENCOUNTER — Encounter: Payer: Self-pay | Admitting: Urology

## 2024-01-02 DIAGNOSIS — N402 Nodular prostate without lower urinary tract symptoms: Secondary | ICD-10-CM

## 2024-01-02 DIAGNOSIS — R972 Elevated prostate specific antigen [PSA]: Secondary | ICD-10-CM

## 2024-01-29 ENCOUNTER — Ambulatory Visit
Admission: RE | Admit: 2024-01-29 | Discharge: 2024-01-29 | Disposition: A | Discharge status: Home / Self Care | Source: Ambulatory Visit | Attending: Urology | Admitting: Urology

## 2024-01-29 DIAGNOSIS — N4289 Other specified disorders of prostate: Secondary | ICD-10-CM | POA: Diagnosis not present

## 2024-01-29 DIAGNOSIS — N402 Nodular prostate without lower urinary tract symptoms: Secondary | ICD-10-CM

## 2024-01-29 DIAGNOSIS — R972 Elevated prostate specific antigen [PSA]: Secondary | ICD-10-CM

## 2024-01-29 MED ORDER — GADOPICLENOL 0.5 MMOL/ML IV SOLN
10.0000 mL | Freq: Once | INTRAVENOUS | Status: AC | PRN
  Administered 2024-01-29: 10 mL via INTRAVENOUS

## 2024-02-11 ENCOUNTER — Other Ambulatory Visit

## 2024-03-10 DIAGNOSIS — M9901 Segmental and somatic dysfunction of cervical region: Secondary | ICD-10-CM | POA: Diagnosis not present

## 2024-03-10 DIAGNOSIS — M9904 Segmental and somatic dysfunction of sacral region: Secondary | ICD-10-CM | POA: Diagnosis not present

## 2024-03-10 DIAGNOSIS — M9902 Segmental and somatic dysfunction of thoracic region: Secondary | ICD-10-CM | POA: Diagnosis not present

## 2024-03-10 DIAGNOSIS — M9903 Segmental and somatic dysfunction of lumbar region: Secondary | ICD-10-CM | POA: Diagnosis not present

## 2024-03-31 DIAGNOSIS — C61 Malignant neoplasm of prostate: Secondary | ICD-10-CM | POA: Diagnosis not present

## 2024-03-31 DIAGNOSIS — R972 Elevated prostate specific antigen [PSA]: Secondary | ICD-10-CM | POA: Diagnosis not present

## 2024-04-07 DIAGNOSIS — M9904 Segmental and somatic dysfunction of sacral region: Secondary | ICD-10-CM | POA: Diagnosis not present

## 2024-04-07 DIAGNOSIS — M9903 Segmental and somatic dysfunction of lumbar region: Secondary | ICD-10-CM | POA: Diagnosis not present

## 2024-04-07 DIAGNOSIS — M9901 Segmental and somatic dysfunction of cervical region: Secondary | ICD-10-CM | POA: Diagnosis not present

## 2024-04-07 DIAGNOSIS — M9902 Segmental and somatic dysfunction of thoracic region: Secondary | ICD-10-CM | POA: Diagnosis not present

## 2024-04-08 DIAGNOSIS — C61 Malignant neoplasm of prostate: Secondary | ICD-10-CM | POA: Diagnosis not present

## 2024-04-08 DIAGNOSIS — N402 Nodular prostate without lower urinary tract symptoms: Secondary | ICD-10-CM | POA: Diagnosis not present

## 2024-04-26 NOTE — Progress Notes (Signed)
 GU Location of Tumor / Histology: Prostate Ca  If Prostate Cancer, Gleason Score is (3 + 4) and PSA is (9.36 on 12/30/2023)  Stephen Ferrell presented as referral from Dr. Sherwood Edison [facility]) elevated PSA.  Biopsies      01/29/2024 Dr. Sherwood Edison MR Prostate with/without Contrast CLINICAL DATA:  Elevated PSA level. Prostate nodule.   IMPRESSION: 1. PI-RADS category 4 lesion in the right peripheral zone. Targeting data sent to [facility]. 2. Mild prostatomegaly and benign prostatic hypertrophy.   Past/Anticipated interventions by urology, if any:  Dr. Sherwood Edison   Past/Anticipated interventions by medical oncology, if any: NA  Weight changes, if any:  No  IPSS:    10 SHIM:  20  Bowel/Bladder complaints, if any: Colitis when there a flare up.   Nausea/Vomiting, if any: No  Pain issues, if any:   5/10 neck/back and inner left thigh  SAFETY ISSUES: Prior radiation?  No Pacemaker/ICD? No Possible current pregnancy? Male Is the patient on methotrexate? No  Current Complaints / other details:  None  35 minutes spent total, including time for meaningful use questions, reviewing medication, as well as spent in face-to-face time in nurse evaluation with the patient.

## 2024-05-02 NOTE — Progress Notes (Signed)
 Radiation Oncology         [phone] ________________________________  Initial Outpatient Consultation  Name: Stephen Ferrell MRN: [id number]  Date: 05/03/2024  DOB: 09-Sep-1970  RR:Yldjpw, Ardell, MD  Carolee Sherwood [name], MD   REFERRING PHYSICIAN: Carolee Sherwood [name], MD  DIAGNOSIS: 53 y.o. 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    ICD-10-CM   1. Malignant neoplasm of prostate (HCC)  C61       HISTORY OF PRESENT ILLNESS: Stephen Ferrell is a 53 y.o. male with a diagnosis of prostate cancer. He has been followed by urology for an elevated PSA since 2019, at which time it was 4.9 and previously underwent a prostate biopsy in 03/2018 that was benign. His PSA increased to 6.48 in 2021 but a prostate MRI in 08/2019 was also negative and his PCP resumed his follow up thereafter. More recently, he was noted to have an elevated PSA of 11 on labs by his primary care physician, Dr. Husain.  Accordingly, he was referred back for evaluation in urology by Dr. Carolee on 12/30/23,  digital rectal examination performed at that time showed some induration on the right. A repeat PSA that day remained elevated at 9.36 so he underwent a repeat prostate MRI on 01/29/24 that showed a PI-RADS 4 lesion in the right peripheral zone. The patient proceeded to MRI fusion biopsy of the prostate on 03/31/24.  The prostate volume measured 49.94 cc.  Out of 16 core biopsies, 6 were positive.  The maximum Gleason score was 3+4, and this was seen in three cores from the MRI ROI (with perineural invasion). Additionally, Gleason 3+3 was seen in the left mid lateral, right base, and right apex (small focus).  The patient reviewed the biopsy results with his urologist and he has kindly been referred today for discussion of potential radiation treatment options. He is also scheduled for a consult visit with Dr. Renda 05/25/24 to discuss his surgical options.   PREVIOUS  RADIATION THERAPY: No  PAST MEDICAL HISTORY:  Past Medical History:  Diagnosis Date   BCC (basal cell carcinoma of skin)    Elevated PSA    Left inguinal hernia    Migraines    Prostate cancer (HCC)    Ulcerative colitis (HCC)       PAST SURGICAL HISTORY: Past Surgical History:  Procedure Laterality Date   COLONOSCOPY     INGUINAL HERNIA REPAIR Left 08/01/2022   Procedure: LAPAROSCOPIC LEFT INGUINAL HERNIA REPAIR WITH MESH;  Surgeon: Vernetta Berg, MD;  Location: [facility];  Service: General;  Laterality: Left;   KNEE ARTHROSCOPY Left    PROSTATE BIOPSY      FAMILY HISTORY:  Family History  Problem Relation Age of Onset   Cancer Mother        melanoma, breast cancer   Asthma Father    Hypertension Father    Hyperlipidemia Father    Eczema Father    Mental illness Brother     SOCIAL HISTORY:  Social History   Socioeconomic History   Marital status: Married    Spouse name: Not on file   Number of children: Not on file   Years of education: Not on file   Highest education level: Not on file  Occupational History   Not on file  Tobacco Use   Smoking status: Never    Passive exposure: Never   Smokeless tobacco: Never  Vaping Use  Vaping status: Never Used  Substance and Sexual Activity   Alcohol use: Yes    Alcohol/week: 4.0 standard drinks of alcohol    Types: 4 Standard drinks or equivalent per week    Comment: occ.   Drug use: Not on file    Comment: some days   Sexual activity: Yes  Other Topics Concern   Not on file  Social History Narrative   Not on file   Social Drivers of Health   Financial Resource Strain: Not on file  Food Insecurity: No Food Insecurity (05/03/2024)   Hunger Vital Sign    Worried About Running Out of Food in the Last Year: Never true    Ran Out of Food in the Last Year: Never true  Transportation Needs: No Transportation Needs (05/03/2024)   PRAPARE - Administrator, Civil Service (Medical):  No    Lack of Transportation (Non-Medical): No  Physical Activity: Not on file  Stress: Not on file  Social Connections: Not on file  Intimate Partner Violence: Not At Risk (05/03/2024)   Humiliation, Afraid, Rape, and Kick questionnaire    Fear of Current or Ex-Partner: No    Emotionally Abused: No    Physically Abused: No    Sexually Abused: No    ALLERGIES: Honey bee venom and Other  MEDICATIONS:  Current Outpatient Medications  Medication Sig Dispense Refill   Budesonide ER 9 MG TB24 Take 9 mg by mouth as needed.     hydrocortisone (ANUSOL-HC) 25 MG suppository Place 25 mg rectally as needed.     Multiple Vitamin (MULTI-VITAMIN) tablet Take 1 tablet by mouth daily.     SUMAtriptan (IMITREX) 25 MG tablet Take 25 mg by mouth as needed.     AUVI-Q  0.3 MG/0.3ML SOAJ injection Use as directed for life-threatening allergic reaction. 2 Device 0   cholecalciferol (VITAMIN D3) 25 MCG (1000 UNIT) tablet Take 1,000 Units by mouth daily.     mesalamine (LIALDA) 1.2 g EC tablet Take 4.8 g by mouth daily.      No current facility-administered medications for this encounter.    REVIEW OF SYSTEMS:  On review of systems, the patient reports that he is doing well overall. He denies any chest pain, shortness of breath, cough, fevers, chills, night sweats, unintended weight changes. He denies any bowel disturbances, and denies abdominal pain, nausea or vomiting. He denies any new musculoskeletal or joint aches or pains. His IPSS was 10, indicating moderate urinary symptoms. His SHIM was 20, indicating he does not have erectile dysfunction. A complete review of systems is obtained and is otherwise negative.    PHYSICAL EXAM:  Wt Readings from Last 3 Encounters:  05/03/24 186 lb (84.4 kg)  08/01/22 183 lb 6.8 oz (83.2 kg)  06/06/22 180 lb (81.6 kg)   Temp Readings from Last 3 Encounters:  05/03/24 97.6 F (36.4 C)  08/01/22 (!) 97.4 F (36.3 C) (Oral)  07/15/16 97.6 F (36.4 C) (Oral)   BP  Readings from Last 3 Encounters:  05/03/24 133/82  08/01/22 (!) 117/91  06/06/22 122/68   Pulse Readings from Last 3 Encounters:  05/03/24 63  08/01/22 62  06/06/22 68    /10  In general this is a well appearing Caucasian male in no acute distress. He's alert and oriented x4 and appropriate throughout the examination. Cardiopulmonary assessment is negative for acute distress, and he exhibits normal effort.     KPS = 100  100 - Normal; no complaints; no evidence  of disease. 90   - Able to carry on normal activity; minor signs or symptoms of disease. 80   - Normal activity with effort; some signs or symptoms of disease. 3   - Cares for self; unable to carry on normal activity or to do active work. 60   - Requires occasional assistance, but is able to care for most of his personal needs. 50   - Requires considerable assistance and frequent medical care. 40   - Disabled; requires special care and assistance. 30   - Severely disabled; hospital admission is indicated although death not imminent. 20   - Very sick; hospital admission necessary; active supportive treatment necessary. 10   - Moribund; fatal processes progressing rapidly. 0     - Dead  Karnofsky DA, Abelmann WH, Craver LS and Burchenal [facility] [phone]) The use of the nitrogen mustards in the palliative treatment of carcinoma: with particular reference to bronchogenic carcinoma Cancer 1 [phone]  LABORATORY DATA:  Lab Results  Component Value Date   WBC 6.7 07/25/2011   HGB 15.8 07/25/2011   HCT 44.5 07/25/2011   MCV 89.0 07/25/2011   PLT 180 07/25/2011   Lab Results  Component Value Date   NA 139 07/25/2011   K 4.2 07/25/2011   CL 102 07/25/2011   CO2 29 07/25/2011   Lab Results  Component Value Date   ALT 12 05/24/2009   AST 13 05/24/2009   ALKPHOS 68 05/24/2009   BILITOT 0.6 05/24/2009     RADIOGRAPHY: No results found.    IMPRESSION/PLAN: 1. 53 y.o. gentleman with Stage T2a adenocarcinoma of the prostate  with Gleason Score of 3+4, and PSA of 9.36. We discussed the patient's workup and outlined the nature of prostate cancer in this setting. The patient's T stage, Gleason's score, and PSA put him into the favorable intermediate risk group. Accordingly, he is eligible for a variety of potential treatment options including brachytherapy, 5.5 weeks of external radiation, or prostatectomy. We discussed the available radiation techniques, and focused on the details and logistics of delivery. We discussed and outlined the risks, benefits, short and long-term effects associated with radiotherapy and compared and contrasted these with prostatectomy. We discussed the role of SpaceOAR gel in reducing the rectal toxicity associated with radiotherapy. He appears to have a good understanding of his disease and our treatment recommendations which are of curative intent.  He was encouraged to ask questions that were answered to his stated satisfaction.  At the conclusion of our conversation, the patient is undecided and would like to learn more about his surgical options prior to committing to treatment. He has a scheduled consult visit with Dr. Renda 05/25/24 and plans to reach a decision shortly thereafter.He has our contact information and will let us  know if he ultimately elects to proceed with one of the radiation options and we will move forward with treatment planning accordingly, at that time. We enjoyed meeting him and his wife today and look forward to continuing to participate in his care.  We personally spent 70 minutes in this encounter including chart review, reviewing radiological studies, meeting face-to-face with the patient, entering orders and completing documentation.    Sabra MICAEL Rusk, PA-C    Donnice Barge, MD  [facility]  Radiation Oncology Direct Dial: [phone]  Fax: [phone] [email or web address]  Skype  LinkedIn   This document serves as a record of services personally  performed by Donnice Barge, MD and Sabra Rusk, PA-C. It was created on their behalf  by Izetta Neither, a trained medical scribe. The creation of this record is based on the scribe's personal observations and the provider's statements to them. This document has been checked and approved by the attending provider.

## 2024-05-03 ENCOUNTER — Encounter: Payer: Self-pay | Admitting: Radiation Oncology

## 2024-05-03 ENCOUNTER — Ambulatory Visit
Admission: RE | Admit: 2024-05-03 | Discharge: 2024-05-03 | Disposition: A | Discharge status: Home / Self Care | Source: Ambulatory Visit | Attending: Radiation Oncology | Admitting: Radiation Oncology

## 2024-05-03 ENCOUNTER — Ambulatory Visit
Admission: RE | Admit: 2024-05-03 | Discharge: 2024-05-03 | Disposition: A | Discharge status: Home / Self Care | Source: Ambulatory Visit | Attending: Radiation Oncology

## 2024-05-03 VITALS — BP 133/82 | HR 63 | Temp 97.6°F | Resp 20 | Ht 78.0 in | Wt 186.0 lb

## 2024-05-03 DIAGNOSIS — C61 Malignant neoplasm of prostate: Secondary | ICD-10-CM | POA: Insufficient documentation

## 2024-05-03 DIAGNOSIS — K449 Diaphragmatic hernia without obstruction or gangrene: Secondary | ICD-10-CM | POA: Diagnosis not present

## 2024-05-03 DIAGNOSIS — Z85828 Personal history of other malignant neoplasm of skin: Secondary | ICD-10-CM | POA: Insufficient documentation

## 2024-05-03 DIAGNOSIS — Z79899 Other long term (current) drug therapy: Secondary | ICD-10-CM | POA: Diagnosis not present

## 2024-05-03 DIAGNOSIS — Z191 Hormone sensitive malignancy status: Secondary | ICD-10-CM | POA: Diagnosis not present

## 2024-05-03 DIAGNOSIS — Z803 Family history of malignant neoplasm of breast: Secondary | ICD-10-CM | POA: Diagnosis not present

## 2024-05-03 DIAGNOSIS — K5190 Ulcerative colitis, unspecified, without complications: Secondary | ICD-10-CM | POA: Insufficient documentation

## 2024-05-03 HISTORY — DX: Elevated prostate specific antigen (PSA): R97.20

## 2024-05-03 HISTORY — DX: Malignant neoplasm of prostate: C61

## 2024-05-03 NOTE — Progress Notes (Signed)
 Introduced myself to the patient, and his wife, as the prostate nurse navigator.  No barriers to care identified at this time.  He is here to discuss his radiation treatment options and will also have a surgical consult with Dr. Renda on 10/14.  I gave him my business card and asked him to call me with questions or concerns.  Verbalized understanding. RN will follow up after surgical consult for treatment decision.

## 2024-05-04 DIAGNOSIS — C61 Malignant neoplasm of prostate: Secondary | ICD-10-CM | POA: Insufficient documentation

## 2024-05-05 DIAGNOSIS — M9902 Segmental and somatic dysfunction of thoracic region: Secondary | ICD-10-CM | POA: Diagnosis not present

## 2024-05-05 DIAGNOSIS — M9903 Segmental and somatic dysfunction of lumbar region: Secondary | ICD-10-CM | POA: Diagnosis not present

## 2024-05-05 DIAGNOSIS — M9904 Segmental and somatic dysfunction of sacral region: Secondary | ICD-10-CM | POA: Diagnosis not present

## 2024-05-05 DIAGNOSIS — M9901 Segmental and somatic dysfunction of cervical region: Secondary | ICD-10-CM | POA: Diagnosis not present

## 2024-05-25 DIAGNOSIS — C61 Malignant neoplasm of prostate: Secondary | ICD-10-CM | POA: Diagnosis not present

## 2024-05-28 ENCOUNTER — Telehealth: Payer: Self-pay | Admitting: *Deleted

## 2024-05-28 NOTE — Telephone Encounter (Signed)
 Called patient to ask questions, spoke with patient

## 2024-05-30 NOTE — Progress Notes (Signed)
 Patient will proceed with brachytherapy for treatment of is his Stage T2a adenocarcinoma of the prostate with Gleason Score of 3+4, and PSA of 9.36.   MD's notified.  Pending date at this time. Plan of care in progress.

## 2024-06-01 ENCOUNTER — Telehealth: Payer: Self-pay | Admitting: *Deleted

## 2024-06-01 NOTE — Telephone Encounter (Signed)
CALLED PATIENT TO UPDATE, LVM FOR A RETURN CALL 

## 2024-06-02 DIAGNOSIS — M9902 Segmental and somatic dysfunction of thoracic region: Secondary | ICD-10-CM | POA: Diagnosis not present

## 2024-06-02 DIAGNOSIS — M9903 Segmental and somatic dysfunction of lumbar region: Secondary | ICD-10-CM | POA: Diagnosis not present

## 2024-06-02 DIAGNOSIS — M9901 Segmental and somatic dysfunction of cervical region: Secondary | ICD-10-CM | POA: Diagnosis not present

## 2024-06-02 DIAGNOSIS — M9904 Segmental and somatic dysfunction of sacral region: Secondary | ICD-10-CM | POA: Diagnosis not present

## 2024-06-07 ENCOUNTER — Other Ambulatory Visit: Payer: Self-pay | Admitting: Urology

## 2024-06-07 ENCOUNTER — Telehealth: Payer: Self-pay | Admitting: *Deleted

## 2024-06-07 NOTE — Telephone Encounter (Signed)
 CALLED PATIENT TO INFORM OF PRE-SEED APPTS. FOR 07-01-24 AND HIS IMPLANT FOR 07-19-24, SPOKE WITH PATIENT AND HE IS AWARE OF THESE APPTS.

## 2024-06-28 NOTE — Progress Notes (Signed)
 Pre-seed nursing interview for a diagnosis of 53 y.o. 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    Patient identity verified x2.   Patient states issues as follows...  -Pain: None -Fatigue: None -Abdomen: None -Groin: None -Urinary: None -Bowels: none currently, but has a history of ulcerative colitis  -Appetite: Normal -Weight: 183.8  Patient denies all other related issues at this time.  Meaningful use complete.  Urinary Management medication(s)- none Urology appointment date- Patient is unsure of next appt with Dr. Sherwood Edison   No vitals needed for this visit.  This concludes the interaction.  Patient declines to watch 'PRE-SEED' EDUCATION VIDEO

## 2024-06-29 ENCOUNTER — Telehealth: Payer: Self-pay | Admitting: *Deleted

## 2024-06-29 NOTE — Telephone Encounter (Signed)
 CALLED PATIENT TO REMIND OF PRE-SEED APPTS. FOR 07/01/24, SPOKE WITH PATENT AND HE IS AWARE OF THESE APPTS.

## 2024-06-29 NOTE — Progress Notes (Signed)
 COVID Vaccine received:  []  No [x]  Yes Date of any COVID positive Test in last 90 days: no PCP - Ardell Manly MD Cardiologist - n/a  Chest x-ray -  EKG -   Stress Test -  ECHO -  Cardiac Cath -   Bowel Prep - [x]  No  []   Yes ______  Pacemaker / ICD device [x]  No []  Yes   Spinal Cord Stimulator:[x]  No []  Yes       History of Sleep Apnea? [x]  No []  Yes   CPAP used?- [x]  No []  Yes    Does the patient monitor blood sugar?          [x]  No []  Yes  []  N/A  Patient has: [x]  NO Hx DM   []  Pre-DM                 []  DM1  []   DM2 Does patient have a [employer] or Dexacom? []  No []  Yes   Fasting Blood Sugar Ranges-  Checks Blood Sugar _____ times a day  GLP1 agonist / usual dose - no GLP1 instructions:  SGLT-2 inhibitors / usual dose - no SGLT-2 instructions:   Blood Thinner / Instructions:no Aspirin Instructions:no  Comments:   Activity level: Patient is able / to climb a flight of stairs without difficulty; []  No CP  []  No SOB,    Patient can perform ADLs without assistance.   Anesthesia review:   Patient denies shortness of breath, fever, cough and chest pain at PAT appointment.  Patient verbalized understanding and agreement to the Pre-Surgical Instructions that were given to them at this PAT appointment. Patient was also educated of the need to review these PAT instructions again prior to his/her surgery.I reviewed the appropriate phone numbers to call if they have any and questions or concerns.

## 2024-06-29 NOTE — Patient Instructions (Signed)
 SURGICAL WAITING ROOM VISITATION  Patients having surgery or a procedure may have no more than 2 support people in the waiting area - these visitors may rotate.    Children under the age of 2 must have an adult with them who is not the patient.  Visitors with respiratory illnesses are discouraged from visiting and should remain at home.  If the patient needs to stay at the hospital during part of their recovery, the visitor guidelines for inpatient rooms apply. Pre-op nurse will coordinate an appropriate time for 1 support person to accompany patient in pre-op.  This support person may not rotate.    Please refer to the [facility] website for the visitor guidelines for Inpatients (after your surgery is over and you are in a regular room).       Your procedure is scheduled on: 07/19/24   Report to [facility], The Main Entrance    Report to admitting at 11:45 AM   Call this number if you have problems the morning of surgery [phone]   Do not eat food or drink liquids:After Midnight. But may have sips of water to take meds.    FOLLOW BOWEL PREP AND ANY ADDITIONAL PRE OP INSTRUCTIONS YOU RECEIVED FROM YOUR SURGEON'S OFFICE!!!          Fleet enema   Oral Hygiene is also important to reduce your risk of infection.                                    Remember - BRUSH YOUR TEETH THE MORNING OF SURGERY WITH YOUR REGULAR TOOTHPASTE   Stop all vitamins and herbal supplements 7 days before surgery.   Take these medicines the morning of surgery with A SIP OF WATER: tylenol  if needed, Budesonide if needed.             You may not have any metal on your body including hair pins, jewelry, and body piercing             Do not wear make-up, lotions, powders, perfumes/cologne, or deodorant              Men may shave face and neck.   Do not bring valuables to the hospital. [facility] IS NOT             RESPONSIBLE   FOR VALUABLES.   Contacts, glasses, dentures or bridgework may not  be worn into surgery.   DO NOT BRING YOUR HOME MEDICATIONS TO THE HOSPITAL. PHARMACY WILL DISPENSE MEDICATIONS LISTED ON YOUR MEDICATION LIST TO YOU DURING YOUR ADMISSION IN THE HOSPITAL!    Patients discharged on the day of surgery will not be allowed to drive home.  Someone NEEDS to stay with you for the first 24 hours after anesthesia.   Special Instructions: Bring a copy of your healthcare power of attorney and living will documents the day of surgery if you haven't scanned them before.              Please read over the following fact sheets you were given: IF YOU HAVE QUESTIONS ABOUT YOUR PRE-OP INSTRUCTIONS PLEASE CALL [phone] [place]   If you received a COVID test during your pre-op visit  it is requested that you wear a mask when out in public, stay away from anyone that may not be feeling well and notify your surgeon if you develop symptoms. If you test positive for  Covid or have been in contact with anyone that has tested positive in the last 10 days please notify you surgeon.    [facility] - Preparing for Surgery Before surgery, you can play an important role.  Because skin is not sterile, your skin needs to be as free of germs as possible.  You can reduce the number of germs on your skin by washing with CHG (chlorahexidine gluconate) soap before surgery.  CHG is an antiseptic cleaner which kills germs and bonds with the skin to continue killing germs even after washing. Please DO NOT use if you have an allergy  to CHG or antibacterial soaps.  If your skin becomes reddened/irritated stop using the CHG and inform your nurse when you arrive at Short Stay. Do not shave (including legs and underarms) for at least 48 hours prior to the first CHG shower.  You may shave your face/neck.  Please follow these instructions carefully:  1.  Shower with CHG Soap the night before surgery and the morning of surgery.  2.  If you choose to wash your hair, wash your hair first as usual with your  normal  shampoo.  3.  After you shampoo, rinse your hair and body thoroughly to remove the shampoo.                             4.  Use CHG as you would any other liquid soap.  You can apply chg directly to the skin and wash.  Gently with a scrungie or clean washcloth.  5.  Apply the CHG Soap to your body ONLY FROM THE NECK DOWN.   Do   not use on face/ open                           Wound or open sores. Avoid contact with eyes, ears mouth and   genitals (private parts).                       Wash face,  Genitals (private parts) with your normal soap.             6.  Wash thoroughly, paying special attention to the area where your    surgery  will be performed.  7.  Thoroughly rinse your body with warm water from the neck down.  8.  DO NOT shower/wash with your normal soap after using and rinsing off the CHG Soap.                9.  Pat yourself dry with a clean towel.            10.  Wear clean pajamas.            11.  Place clean sheets on your bed the night of your first shower and do not  sleep with pets. Day of Surgery : Do not apply any CHG, lotions/deodorants the morning of surgery.  Please wear clean clothes to the hospital/surgery center.  FAILURE TO FOLLOW THESE INSTRUCTIONS MAY RESULT IN THE CANCELLATION OF YOUR SURGERY  PATIENT SIGNATURE_________________________________  NURSE SIGNATURE__________________________________  ________________________________________________________________________

## 2024-06-30 DIAGNOSIS — M9901 Segmental and somatic dysfunction of cervical region: Secondary | ICD-10-CM | POA: Diagnosis not present

## 2024-06-30 DIAGNOSIS — M9905 Segmental and somatic dysfunction of pelvic region: Secondary | ICD-10-CM | POA: Diagnosis not present

## 2024-06-30 DIAGNOSIS — M9903 Segmental and somatic dysfunction of lumbar region: Secondary | ICD-10-CM | POA: Diagnosis not present

## 2024-06-30 DIAGNOSIS — M9904 Segmental and somatic dysfunction of sacral region: Secondary | ICD-10-CM | POA: Diagnosis not present

## 2024-06-30 NOTE — Progress Notes (Signed)
  Radiation Oncology         [phone]) [phone] ________________________________  Name: Stephen Ferrell MRN: [id number]  Date: 07/01/2024  DOB: 1971/08/07  SIMULATION AND TREATMENT PLANNING NOTE PUBIC ARCH STUDY Definitive Seed Implant as Monotherapy  RR:Yldjpw, Ardell, MD  Carolee Sherwood [name], MD  DIAGNOSIS:  53 y.o. gentleman with Stage T2a adenocarcinoma of the prostate with Gleason Score of 3+4, and PSA of 9.36.   Oncology History  Malignant neoplasm of prostate (HCC)  03/31/2024 Cancer Staging   Staging form: Prostate, AJCC 8th Edition - Clinical stage from 03/31/2024: Stage IIB (cT2a, cN0, cM0, PSA: 9.4, Grade Group: 2) - Signed by Sherwood Rise, PA-C on 05/04/2024 Histopathologic type: Adenocarcinoma, NOS Stage prefix: Initial diagnosis Prostate specific antigen (PSA) range: Less than 10 Gleason primary pattern: 3 Gleason secondary pattern: 4 Gleason score: 7 Histologic grading system: 5 grade system Number of biopsy cores examined: 16 Number of biopsy cores positive: 6 Location of positive needle core biopsies: Both sides   05/04/2024 Initial Diagnosis   Malignant neoplasm of prostate (HCC)       ICD-10-CM   1. Malignant neoplasm of prostate (HCC)  C61       COMPLEX SIMULATION:  The patient presented today for evaluation for possible prostate seed implant. He was brought to the radiation planning suite and placed supine on the CT couch. A 3-dimensional image study set was obtained in upload to the planning computer. There, on each axial slice, I contoured the prostate gland. Then, using three-dimensional radiation planning tools I reconstructed the prostate in view of the structures from the transperineal needle pathway to assess for possible pubic arch interference. In doing so, I did not appreciate any pubic arch interference. Also, the patient's prostate volume was estimated based on the drawn structure. The volume was 51 cc.  Given the pubic arch appearance and prostate  volume, patient remains a good candidate to proceed with prostate seed implant. Today, he freely provided informed written consent to proceed.    PLAN: The patient will undergo prostate seed implant to 145 Gy.   ________________________________  Donnice LABOR. Patrcia, M.D.

## 2024-07-01 ENCOUNTER — Encounter (HOSPITAL_COMMUNITY): Payer: Self-pay

## 2024-07-01 ENCOUNTER — Other Ambulatory Visit: Payer: Self-pay

## 2024-07-01 ENCOUNTER — Encounter (HOSPITAL_COMMUNITY)
Admission: RE | Admit: 2024-07-01 | Discharge: 2024-07-01 | Disposition: A | Discharge status: Home / Self Care | Source: Ambulatory Visit | Attending: Urology | Admitting: Urology

## 2024-07-01 ENCOUNTER — Ambulatory Visit
Admission: RE | Admit: 2024-07-01 | Discharge: 2024-07-01 | Disposition: A | Discharge status: Home / Self Care | Source: Ambulatory Visit | Attending: Urology | Admitting: Urology

## 2024-07-01 ENCOUNTER — Ambulatory Visit
Admission: RE | Admit: 2024-07-01 | Discharge: 2024-07-01 | Attending: Radiation Oncology | Admitting: Radiation Oncology

## 2024-07-01 VITALS — BP 127/89 | HR 63 | Resp 16 | Ht 78.0 in | Wt 183.0 lb

## 2024-07-01 DIAGNOSIS — C61 Malignant neoplasm of prostate: Secondary | ICD-10-CM | POA: Insufficient documentation

## 2024-07-01 DIAGNOSIS — Z191 Hormone sensitive malignancy status: Secondary | ICD-10-CM | POA: Diagnosis not present

## 2024-07-01 DIAGNOSIS — Z01812 Encounter for preprocedural laboratory examination: Secondary | ICD-10-CM | POA: Insufficient documentation

## 2024-07-01 DIAGNOSIS — Z01818 Encounter for other preprocedural examination: Secondary | ICD-10-CM

## 2024-07-01 HISTORY — DX: Unspecified osteoarthritis, unspecified site: M19.90

## 2024-07-01 LAB — CBC
HCT: 45 % (ref 39.0–52.0)
Hemoglobin: 15.4 g/dL (ref 13.0–17.0)
MCH: 31.1 pg (ref 26.0–34.0)
MCHC: 34.2 g/dL (ref 30.0–36.0)
MCV: 90.9 fL (ref 80.0–100.0)
Platelets: 217 K/uL (ref 150–400)
RBC: 4.95 MIL/uL (ref 4.22–5.81)
RDW: 13 % (ref 11.5–15.5)
WBC: 5.6 K/uL (ref 4.0–10.5)
nRBC: 0 % (ref 0.0–0.2)

## 2024-07-01 NOTE — Progress Notes (Addendum)
 Radiation Oncology         [phone]) [phone] ________________________________  Outpatient Follow up- Pre-seed visit  Name: Stephen Ferrell MRN: [id number]  Date: 07/01/2024  DOB: [birth year]  RR:Yldjpw, Ardell, MD  Carolee Sherwood [name], MD   REFERRING PHYSICIAN: Carolee Sherwood [name], MD  DIAGNOSIS: 53 y.o. 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    ICD-10-CM   1. Malignant neoplasm of prostate (HCC)  C61       HISTORY OF PRESENT ILLNESS: Stephen Ferrell is a 53 y.o. male with a diagnosis of prostate cancer. He has been followed by urology for an elevated PSA since 2019, at which time it was 4.9 and previously underwent a prostate biopsy in 03/2018 that was benign. His PSA increased to 6.48 in 2021 but a prostate MRI in 08/2019 was also negative and his PCP resumed his follow up thereafter. More recently, he was noted to have an elevated PSA of 11 on labs by his primary care physician, Dr. Husain.  Accordingly, he was referred back for evaluation in urology by Dr. Carolee on 12/30/23,  digital rectal examination performed at that time showed some induration on the right. A repeat PSA that day remained elevated at 9.36 so he underwent a repeat prostate MRI on 01/29/24 that showed a PI-RADS 4 lesion in the right peripheral zone. The patient proceeded to MRI fusion biopsy of the prostate on 03/31/24.  The prostate volume measured 49.94 cc.  Out of 16 core biopsies, 6 were positive.  The maximum Gleason score was 3+4, and this was seen in three cores from the MRI ROI (with perineural invasion). Additionally, Gleason 3+3 was seen in the left mid lateral, right base, and right apex (small focus).   The patient reviewed the biopsy results with his urologist and was kindly referred to us  for discussion of potential radiation treatment options. We initially met the patient on 05/03/2024 and he was most interested in proceeding with brachytherapy and SpaceOAR gel  placement for treatment of his disease. He is here today for his pre-procedure imaging for planning and to answer any additional questions he may have about this treatment.   PREVIOUS RADIATION THERAPY: No  PAST MEDICAL HISTORY:  Past Medical History:  Diagnosis Date   Arthritis    BCC (basal cell carcinoma of skin)    Elevated PSA    Left inguinal hernia    Migraines    Prostate cancer (HCC)    Ulcerative colitis (HCC)       PAST SURGICAL HISTORY: Past Surgical History:  Procedure Laterality Date   COLONOSCOPY     INGUINAL HERNIA REPAIR Left 08/01/2022   Procedure: LAPAROSCOPIC LEFT INGUINAL HERNIA REPAIR WITH MESH;  Surgeon: Vernetta Berg, MD;  Location: [facility];  Service: General;  Laterality: Left;   KNEE ARTHROSCOPY Left    PROSTATE BIOPSY      FAMILY HISTORY:  Family History  Problem Relation Age of Onset   Cancer Mother        melanoma, breast cancer   Asthma Father    Hypertension Father    Hyperlipidemia Father    Eczema Father    Mental illness Brother     SOCIAL HISTORY:  Social History   Socioeconomic History   Marital status: Married    Spouse name: Not on file   Number of children: Not on file   Years of education: Not on file   Highest education  level: Not on file  Occupational History   Not on file  Tobacco Use   Smoking status: Never    Passive exposure: Never   Smokeless tobacco: Never  Vaping Use   Vaping status: Never Used  Substance and Sexual Activity   Alcohol use: Not Currently    Alcohol/week: 1.0 standard drink of alcohol    Types: 1 Cans of beer per week    Comment: per week   Drug use: Not Currently    Types: Marijuana    Comment: some days   Sexual activity: Yes  Other Topics Concern   Not on file  Social History Narrative   Not on file   Social Drivers of Health   Financial Resource Strain: Not on file  Food Insecurity: No Food Insecurity (05/03/2024)   Hunger Vital Sign    Worried About  Running Out of Food in the Last Year: Never true    Ran Out of Food in the Last Year: Never true  Transportation Needs: No Transportation Needs (05/03/2024)   PRAPARE - Administrator, Civil Service (Medical): No    Lack of Transportation (Non-Medical): No  Physical Activity: Not on file  Stress: Not on file  Social Connections: Not on file  Intimate Partner Violence: Not At Risk (05/03/2024)   Humiliation, Afraid, Rape, and Kick questionnaire    Fear of Current or Ex-Partner: No    Emotionally Abused: No    Physically Abused: No    Sexually Abused: No    ALLERGIES: Honey bee venom and Other  MEDICATIONS:  Current Outpatient Medications  Medication Sig Dispense Refill   acetaminophen  (TYLENOL ) 500 MG tablet Take 500 mg by mouth every 6 (six) hours as needed for moderate pain (pain score 4-6).     AUVI-Q  0.3 MG/0.3ML SOAJ injection Use as directed for life-threatening allergic reaction. 2 Device 0   Budesonide ER 9 MG TB24 Take 9 mg by mouth daily as needed (ulcerative colitis).     cholecalciferol (VITAMIN D3) 25 MCG (1000 UNIT) tablet Take 1,000 Units by mouth daily.     hydrocortisone (ANUSOL-HC) 25 MG suppository Place 25 mg rectally as needed for hemorrhoids or anal itching.     mesalamine (LIALDA) 1.2 g EC tablet Take 4.8 g by mouth daily.      Multiple Vitamin (MULTI-VITAMIN) tablet Take 1 tablet by mouth daily.     SUMAtriptan (IMITREX) 25 MG tablet Take 25 mg by mouth as needed for migraine.     No current facility-administered medications for this encounter.    REVIEW OF SYSTEMS:  On review of systems, the patient reports that he is doing well overall. He denies any chest pain, shortness of breath, cough, fevers, chills, night sweats, unintended weight changes. He denies any bowel disturbances, and denies abdominal pain, nausea or vomiting. He denies any new musculoskeletal or joint aches or pains. His IPSS was 10, indicating moderate urinary symptoms. His SHIM was  20, indicating he does not have erectile dysfunction. A complete review of systems is obtained and is otherwise negative.     PHYSICAL EXAM:  Wt Readings from Last 3 Encounters:  07/01/24 83 kg  05/03/24 84.4 kg  08/01/22 83.2 kg   Temp Readings from Last 3 Encounters:  05/03/24 97.6 F (36.4 C)  08/01/22 (!) 97.4 F (36.3 C) (Oral)  07/15/16 97.6 F (36.4 C) (Oral)   BP Readings from Last 3 Encounters:  07/01/24 127/89  05/03/24 133/82  08/01/22 (!) 117/91  Pulse Readings from Last 3 Encounters:  07/01/24 63  05/03/24 63  08/01/22 62   Pain Assessment Pain Score: 0-No pain/10  In general this is a well appearing Caucasian male in no acute distress. He's alert and oriented x4 and appropriate throughout the examination. Cardiopulmonary assessment is negative for acute distress, and he exhibits normal effort.     KPS = 100  100 - Normal; no complaints; no evidence of disease. 90   - Able to carry on normal activity; minor signs or symptoms of disease. 80   - Normal activity with effort; some signs or symptoms of disease. 49   - Cares for self; unable to carry on normal activity or to do active work. 60   - Requires occasional assistance, but is able to care for most of his personal needs. 50   - Requires considerable assistance and frequent medical care. 40   - Disabled; requires special care and assistance. 30   - Severely disabled; hospital admission is indicated although death not imminent. 20   - Very sick; hospital admission necessary; active supportive treatment necessary. 10   - Moribund; fatal processes progressing rapidly. 0     - Dead  Karnofsky DA, Abelmann WH, Craver LS and [facility] [phone]) The use of the nitrogen mustards in the palliative treatment of carcinoma: with particular reference to bronchogenic carcinoma Cancer 1 [id number]  LABORATORY DATA:  Lab Results  Component Value Date   WBC 6.7 07/25/2011   HGB 15.8 07/25/2011   HCT 44.5 07/25/2011    MCV 89.0 07/25/2011   PLT 180 07/25/2011   Lab Results  Component Value Date   NA 139 07/25/2011   K 4.2 07/25/2011   CL 102 07/25/2011   CO2 29 07/25/2011   Lab Results  Component Value Date   ALT 12 05/24/2009   AST 13 05/24/2009   ALKPHOS 68 05/24/2009   BILITOT 0.6 05/24/2009     RADIOGRAPHY: No results found.    IMPRESSION/PLAN: 1. 53 y.o. gentleman with Stage T2a adenocarcinoma of the prostate with Gleason Score of 3+4, and PSA of 9.36. The patient has elected to proceed with seed implant for treatment of his disease. We reviewed the risks, benefits, short and long-term effects associated with brachytherapy and discussed the role of SpaceOAR in reducing the rectal toxicity associated with radiotherapy.  He appears to have a good understanding of his disease and our treatment recommendations which are of curative intent.  He was encouraged to ask questions that were answered to his stated satisfaction. He has freely signed written consent to proceed today in the office and a copy of this document will be placed in his medical record. His procedure is tentatively scheduled for 07/19/2024 in collaboration with Dr. Carolee and we will see him back for his post-procedure visit approximately 3 weeks thereafter. We look forward to continuing to participate in his care. He knows that he is welcome to call with any questions or concerns at any time in the interim.  I personally spent 30 minutes in this encounter including chart review, reviewing radiological studies, meeting face-to-face with the patient, entering orders and completing documentation.   Sabra MICAEL Rusk, MMS, PA-C [facility] Radiation Oncology Physician Assistant Direct Dial: [phone]  Fax: [phone]

## 2024-07-06 DIAGNOSIS — D225 Melanocytic nevi of trunk: Secondary | ICD-10-CM | POA: Diagnosis not present

## 2024-07-06 DIAGNOSIS — Z85828 Personal history of other malignant neoplasm of skin: Secondary | ICD-10-CM | POA: Diagnosis not present

## 2024-07-06 DIAGNOSIS — L821 Other seborrheic keratosis: Secondary | ICD-10-CM | POA: Diagnosis not present

## 2024-07-06 DIAGNOSIS — L814 Other melanin hyperpigmentation: Secondary | ICD-10-CM | POA: Diagnosis not present

## 2024-07-12 ENCOUNTER — Telehealth: Payer: Self-pay | Admitting: *Deleted

## 2024-07-12 NOTE — Telephone Encounter (Signed)
 RETURNED PATIENT'S PHONE CALL, SPOKE WITH PATIENT. ?

## 2024-07-15 ENCOUNTER — Telehealth: Payer: Self-pay | Admitting: *Deleted

## 2024-07-15 NOTE — Telephone Encounter (Signed)
RETURNED PATIENT'S PHONE CALL SPOKE WITH PATIENT

## 2024-07-16 ENCOUNTER — Telehealth: Payer: Self-pay | Admitting: *Deleted

## 2024-07-16 NOTE — Telephone Encounter (Signed)
 CALLED PATIENT TO REMIND OF PROCEDURE FOR 09-09-23, SPOKE WITH PATIENT AND HE IS AWARE OF THIS PROCEDURE

## 2024-07-19 ENCOUNTER — Ambulatory Visit (HOSPITAL_COMMUNITY): Payer: Self-pay | Admitting: Physician Assistant

## 2024-07-19 ENCOUNTER — Encounter (HOSPITAL_COMMUNITY)
Admission: RE | Disposition: A | Discharge status: Home / Self Care | Payer: Self-pay | Source: Home / Self Care | Attending: Urology

## 2024-07-19 ENCOUNTER — Ambulatory Visit (HOSPITAL_COMMUNITY)

## 2024-07-19 ENCOUNTER — Encounter (HOSPITAL_COMMUNITY): Payer: Self-pay | Admitting: Urology

## 2024-07-19 ENCOUNTER — Ambulatory Visit (HOSPITAL_COMMUNITY): Payer: Self-pay | Admitting: Anesthesiology

## 2024-07-19 ENCOUNTER — Other Ambulatory Visit: Payer: Self-pay

## 2024-07-19 ENCOUNTER — Ambulatory Visit (HOSPITAL_COMMUNITY)
Admission: RE | Admit: 2024-07-19 | Discharge: 2024-07-19 | Disposition: A | Discharge status: Home / Self Care | Attending: Urology | Admitting: Urology

## 2024-07-19 DIAGNOSIS — C61 Malignant neoplasm of prostate: Secondary | ICD-10-CM | POA: Insufficient documentation

## 2024-07-19 HISTORY — PX: SPACE OAR INSTILLATION: SHX6769

## 2024-07-19 HISTORY — PX: RADIOACTIVE SEED IMPLANT: SHX5150

## 2024-07-19 SURGERY — INSERTION, RADIATION SOURCE, PROSTATE
Anesthesia: General | Site: Prostate

## 2024-07-19 MED ORDER — PHENYLEPHRINE 80 MCG/ML (10ML) SYRINGE FOR IV PUSH (FOR BLOOD PRESSURE SUPPORT)
PREFILLED_SYRINGE | INTRAVENOUS | Status: AC
  Filled 2024-07-19: qty 10

## 2024-07-19 MED ORDER — SUGAMMADEX SODIUM 200 MG/2ML IV SOLN
INTRAVENOUS | Status: AC
  Filled 2024-07-19: qty 4

## 2024-07-19 MED ORDER — ROCURONIUM BROMIDE 10 MG/ML (PF) SYRINGE
PREFILLED_SYRINGE | INTRAVENOUS | Status: AC
  Filled 2024-07-19: qty 10

## 2024-07-19 MED ORDER — ROCURONIUM BROMIDE 100 MG/10ML IV SOLN
INTRAVENOUS | Status: DC | PRN
  Administered 2024-07-19: 20 mg via INTRAVENOUS
  Administered 2024-07-19: 60 mg via INTRAVENOUS

## 2024-07-19 MED ORDER — SUGAMMADEX SODIUM 200 MG/2ML IV SOLN
INTRAVENOUS | Status: AC
  Filled 2024-07-19: qty 2

## 2024-07-19 MED ORDER — IOHEXOL 300 MG/ML  SOLN
INTRAMUSCULAR | Status: DC | PRN
  Administered 2024-07-19: 5 mL

## 2024-07-19 MED ORDER — ACETAMINOPHEN 500 MG PO TABS
1000.0000 mg | ORAL_TABLET | Freq: Once | ORAL | Status: AC
  Administered 2024-07-19: 1000 mg via ORAL
  Filled 2024-07-19: qty 2

## 2024-07-19 MED ORDER — SODIUM CHLORIDE 0.9 % IV SOLN
2.0000 g | INTRAVENOUS | Status: DC
  Administered 2024-07-19: 2 g via INTRAVENOUS
  Filled 2024-07-19: qty 20

## 2024-07-19 MED ORDER — EPHEDRINE SULFATE (PRESSORS) 25 MG/5ML IV SOSY
PREFILLED_SYRINGE | INTRAVENOUS | Status: DC | PRN
  Administered 2024-07-19: 10 mg via INTRAVENOUS

## 2024-07-19 MED ORDER — OXYCODONE HCL 5 MG PO TABS: 5.0000 mg | ORAL_TABLET | Freq: Once | ORAL | Status: DC | PRN

## 2024-07-19 MED ORDER — LIDOCAINE HCL (PF) 2 % IJ SOLN
INTRAMUSCULAR | Status: AC
  Filled 2024-07-19: qty 5

## 2024-07-19 MED ORDER — SUGAMMADEX SODIUM 200 MG/2ML IV SOLN
INTRAVENOUS | Status: DC | PRN
  Administered 2024-07-19: 330 mg via INTRAVENOUS

## 2024-07-19 MED ORDER — PROPOFOL 10 MG/ML IV BOLUS
INTRAVENOUS | Status: AC
  Filled 2024-07-19: qty 20

## 2024-07-19 MED ORDER — MEPERIDINE HCL 25 MG/ML IJ SOLN: 6.2500 mg | INTRAMUSCULAR | Status: DC | PRN

## 2024-07-19 MED ORDER — LACTATED RINGERS IV SOLN: INTRAVENOUS | Status: DC | PRN

## 2024-07-19 MED ORDER — STERILE WATER FOR IRRIGATION IR SOLN
Status: DC | PRN
  Administered 2024-07-19: 1000 mL

## 2024-07-19 MED ORDER — ONDANSETRON HCL 4 MG/2ML IJ SOLN: 4.0000 mg | Freq: Once | INTRAMUSCULAR | Status: DC | PRN

## 2024-07-19 MED ORDER — LIDOCAINE HCL (PF) 2 % IJ SOLN
INTRAMUSCULAR | Status: DC | PRN
  Administered 2024-07-19: 100 mg via INTRADERMAL

## 2024-07-19 MED ORDER — OXYCODONE HCL 5 MG/5ML PO SOLN: 5.0000 mg | Freq: Once | ORAL | Status: DC | PRN

## 2024-07-19 MED ORDER — LACTATED RINGERS IV SOLN: INTRAVENOUS | Status: DC

## 2024-07-19 MED ORDER — FLEET ENEMA RE ENEM: 1.0000 | ENEMA | Freq: Once | RECTAL | Status: DC

## 2024-07-19 MED ORDER — ONDANSETRON HCL 4 MG/2ML IJ SOLN
INTRAMUSCULAR | Status: DC | PRN
  Administered 2024-07-19: 4 mg via INTRAVENOUS

## 2024-07-19 MED ORDER — FENTANYL CITRATE (PF) 250 MCG/5ML IJ SOLN
INTRAMUSCULAR | Status: AC
  Filled 2024-07-19: qty 5

## 2024-07-19 MED ORDER — ONDANSETRON HCL 4 MG/2ML IJ SOLN
INTRAMUSCULAR | Status: AC
  Filled 2024-07-19: qty 2

## 2024-07-19 MED ORDER — HYALURONIC ACID (RECTAL) 20 MG/ML RE GEL
9.0000 mL | RECTAL | Status: DC
  Filled 2024-07-19: qty 9

## 2024-07-19 MED ORDER — PHENYLEPHRINE 80 MCG/ML (10ML) SYRINGE FOR IV PUSH (FOR BLOOD PRESSURE SUPPORT)
PREFILLED_SYRINGE | INTRAVENOUS | Status: DC | PRN
  Administered 2024-07-19: 80 ug via INTRAVENOUS

## 2024-07-19 MED ORDER — DEXAMETHASONE SOD PHOSPHATE PF 10 MG/ML IJ SOLN
INTRAMUSCULAR | Status: DC | PRN
  Administered 2024-07-19: 10 mg via INTRAVENOUS

## 2024-07-19 MED ORDER — CHLORHEXIDINE GLUCONATE 0.12 % MT SOLN
15.0000 mL | Freq: Once | OROMUCOSAL | Status: AC
  Administered 2024-07-19: 15 mL via OROMUCOSAL

## 2024-07-19 MED ORDER — CIPROFLOXACIN IN D5W 400 MG/200ML IV SOLN
400.0000 mg | INTRAVENOUS | Status: DC
  Filled 2024-07-19: qty 200

## 2024-07-19 MED ORDER — FENTANYL CITRATE (PF) 50 MCG/ML IJ SOSY: 25.0000 ug | PREFILLED_SYRINGE | INTRAMUSCULAR | Status: DC | PRN

## 2024-07-19 MED ORDER — MIDAZOLAM HCL 2 MG/2ML IJ SOLN
INTRAMUSCULAR | Status: AC
  Filled 2024-07-19: qty 2

## 2024-07-19 MED ORDER — HYDROCODONE-ACETAMINOPHEN 5-325 MG PO TABS: 1.0000 | ORAL_TABLET | Freq: Four times a day (QID) | ORAL | 0 refills | Status: AC | PRN

## 2024-07-19 MED ORDER — PROPOFOL 10 MG/ML IV BOLUS
INTRAVENOUS | Status: DC | PRN
  Administered 2024-07-19: 200 mg via INTRAVENOUS

## 2024-07-19 MED ORDER — ORAL CARE MOUTH RINSE: 15.0000 mL | Freq: Once | OROMUCOSAL | Status: AC

## 2024-07-19 MED ORDER — FENTANYL CITRATE (PF) 250 MCG/5ML IJ SOLN
INTRAMUSCULAR | Status: DC | PRN
  Administered 2024-07-19 (×2): 50 ug via INTRAVENOUS

## 2024-07-19 SURGICAL SUPPLY — 31 items
"BARD QUICKLINK CARTRIDGES WITH BRACHYSOURCE I-125 " IMPLANT
BARD QUICKLINK CARTRIDGES WITH BRACHYSOURCE I-125 IMPLANT
BARRIGEL 3ML IMPLANT
BLADE CLIPPER SURG (BLADE) ×2 IMPLANT
CATH ROBINSON RED A/P 20FR (CATHETERS) ×2 IMPLANT
COVER BACK TABLE 60X90IN (DRAPES) ×2 IMPLANT
COVER MAYO STAND STRL (DRAPES) ×2 IMPLANT
DRAPE SURG IRRIG POUCH 19X23 (DRAPES) ×2 IMPLANT
DRSG TEGADERM 4X4.75 (GAUZE/BANDAGES/DRESSINGS) IMPLANT
DRSG TEGADERM 8X12 (GAUZE/BANDAGES/DRESSINGS) ×2 IMPLANT
GLOVE BIO SURGEON STRL SZ7.5 (GLOVE) ×2 IMPLANT
GOWN STRL REUS W/ TWL XL LVL3 (GOWN DISPOSABLE) ×2 IMPLANT
GRID BRACH TEMP 18GA 2.8X3X.75 (MISCELLANEOUS) ×2 IMPLANT
HOLDER FOLEY CATH W/STRAP (MISCELLANEOUS) IMPLANT
KIT TURNOVER KIT A (KITS) ×2 IMPLANT
MARKER SKIN DUAL TIP RULER LAB (MISCELLANEOUS) ×2 IMPLANT
NDL BRACHY 18G 5PK (NEEDLE) ×6 IMPLANT
NDL BRACHY 18G SINGLE (NEEDLE) IMPLANT
NDL PK MORGANSTERN STABILIZ (NEEDLE) ×2 IMPLANT
NEEDLE BRACHY 18G 5PK (NEEDLE) ×6 IMPLANT
NEEDLE BRACHY 18G SINGLE (NEEDLE) IMPLANT
NEEDLE PK MORGANSTERN STABILIZ (NEEDLE) ×2 IMPLANT
PACK CYSTO (CUSTOM PROCEDURE TRAY) ×2 IMPLANT
PREP POVIDONE IODINE SPRAY 2OZ (MISCELLANEOUS) ×2 IMPLANT
SHEATH ULTRASOUND LF (SHEATH) ×2 IMPLANT
SHEATH ULTRASOUND LTX NONSTRL (SHEATH) ×2 IMPLANT
SURGILUBE 2OZ TUBE FLIPTOP (MISCELLANEOUS) ×2 IMPLANT
SYR 10ML LL (SYRINGE) IMPLANT
TOWEL OR DSP ST BLU DLX 10/PK (DISPOSABLE) ×2 IMPLANT
TRAY FOLEY MTR SLVR 16FR STAT (SET/KITS/TRAYS/PACK) ×2 IMPLANT
UNDERPAD 30X36 HEAVY ABSORB (UNDERPADS AND DIAPERS) ×4 IMPLANT

## 2024-07-19 NOTE — Transfer of Care (Signed)
 Immediate Anesthesia Transfer of Care Note  Patient: Stephen Ferrell  Procedure(s) Performed: INSERTION, RADIATION SOURCE, PROSTATE (Prostate) INJECTION, RECTAL SPACER  Patient Location: PACU  Anesthesia Type:General  Level of Consciousness: awake, alert , and oriented  Airway & Oxygen Therapy: Patient Spontanous Breathing and Patient connected to face mask oxygen  Post-op Assessment: Report given to RN and Post -op Vital signs reviewed and stable  Post vital signs: Reviewed  Last Vitals:  Vitals Value Taken Time  BP 130/80 07/19/24 17:00  Temp    Pulse 71 07/19/24 17:01  Resp 14 07/19/24 17:01  SpO2 100 % 07/19/24 17:01  Vitals shown include unfiled device data.  Last Pain:  Vitals:   07/19/24 1203  TempSrc:   PainSc: 0-No pain         Complications: No notable events documented.

## 2024-07-19 NOTE — H&P (Signed)
 H&P Chief Complaint: Prostate cancer  History of Present Illness: 53 year old male with prostate cancer presents for brachytherapy with SpaceOAR versus Barrigel.  Past Medical History:  Diagnosis Date   Arthritis    BCC (basal cell carcinoma of skin)    Elevated PSA    Left inguinal hernia    Migraines    Prostate cancer (HCC)    Ulcerative colitis [facility])    Past Surgical History:  Procedure Laterality Date   COLONOSCOPY     INGUINAL HERNIA REPAIR Left 08/01/2022   Procedure: LAPAROSCOPIC LEFT INGUINAL HERNIA REPAIR WITH MESH;  Surgeon: Vernetta Berg, MD;  Location: [facility];  Service: General;  Laterality: Left;   KNEE ARTHROSCOPY Left    PROSTATE BIOPSY      Home Medications:  Medications Prior to Admission  Medication Sig Dispense Refill Last Dose/Taking   acetaminophen  (TYLENOL ) 500 MG tablet Take 500 mg by mouth every 6 (six) hours as needed for moderate pain (pain score 4-6).   Past Month   AUVI-Q  0.3 MG/0.3ML SOAJ injection Use as directed for life-threatening allergic reaction. 2 Device 0 Taking   Budesonide ER 9 MG TB24 Take 9 mg by mouth daily as needed (ulcerative colitis).   Taking As Needed   cholecalciferol (VITAMIN D3) 25 MCG (1000 UNIT) tablet Take 1,000 Units by mouth daily.   Past Week   hydrocortisone (ANUSOL-HC) 25 MG suppository Place 25 mg rectally as needed for hemorrhoids or anal itching.   Past Month   mesalamine (LIALDA) 1.2 g EC tablet Take 4.8 g by mouth daily.    Past Month   Multiple Vitamin (MULTI-VITAMIN) tablet Take 1 tablet by mouth daily.   Past Week   SUMAtriptan (IMITREX) 25 MG tablet Take 25 mg by mouth as needed for migraine.   Past Month   Allergies:  Allergies  Allergen Reactions   Honey Bee Venom Anaphylaxis   Other Other (See Comments)    Almonds, chicken, soy, tomato, flounder, scallops, cottonseed. Pt told to avoid these s/p allergy  testing.     Family History  Problem Relation Age of Onset   Cancer Mother         melanoma, breast cancer   Asthma Father    Hypertension Father    Hyperlipidemia Father    Eczema Father    Mental illness Brother    Social History:  reports that he has never smoked. He has never been exposed to tobacco smoke. He has never used smokeless tobacco. He reports that he does not currently use alcohol after a past usage of about 1.0 standard drink of alcohol per week. He reports that he does not currently use drugs after having used the following drugs: Marijuana.  ROS: A complete review of systems was performed.  All systems are negative except for pertinent findings as noted. ROS   Physical Exam:  Vital signs in last 24 hours: Temp:  [98.2 F (36.8 C)] 98.2 F (36.8 C) (12/08 1158) Pulse Rate:  [64] 64 (12/08 1158) Resp:  [17] 17 (12/08 1158) BP: (140)/(88) 140/88 (12/08 1158) SpO2:  [100 %] 100 % (12/08 1158) Weight:  [83 kg] 83 kg (12/08 1203) General:  Alert and oriented, No acute distress HEENT: Normocephalic, atraumatic Neck: No JVD or lymphadenopathy Cardiovascular: Regular rate and rhythm Lungs: Regular rate and effort Abdomen: Soft, nontender, nondistended, no abdominal masses Back: No CVA tenderness Extremities: No edema Neurologic: Grossly intact  Laboratory Data:  No results found for this or any previous visit (from  the past 24 hours). No results found for this or any previous visit (from the past 240 hours). Creatinine: No results for input(s): CREATININE in the last 168 hours.  Impression/Assessment:  Prostate cancer  Plan:  Proceed with brachytherapy with SpaceOAR versus Barrigel.  Consent obtained.  Sherwood JONETTA Edison, III 07/19/2024, 2:33 PM

## 2024-07-19 NOTE — Op Note (Signed)
 PATIENT:  Stephen Ferrell  PRE-OPERATIVE DIAGNOSIS:  Adenocarcinoma of the prostate  POST-OPERATIVE DIAGNOSIS:  Same  PROCEDURE:  1. I-125 radioactive seed implantation 2. Cystoscopy  3. Placement of SpaceOAR  SURGEON:  Surgeon(s): Sherwood Carolee MOULD, MD  Radiation oncologist: Dr. Donnice Barge  ANESTHESIA:  General  EBL:  Minimal  DRAINS: 16 French Foley catheter  INDICATION: Stephen Ferrell  Description of procedure: After informed consent the patient was brought to the major OR, placed on the table and administered general anesthesia. He was then moved to the modified lithotomy position with his perineum perpendicular to the floor. His perineum and genitalia were then sterilely prepped. An official timeout was then performed. A 16 French Foley catheter was then placed in the bladder and filled with dilute contrast, a rectal tube was placed in the rectum and the transrectal ultrasound probe was placed in the rectum and affixed to the stand. He was then sterilely draped.  Real time ultrasonography was used along with the seed planning software On[employer] Prostate. This was used to develop the seed plan including the number of needles as well as number of seeds required for complete and adequate coverage. Real-time ultrasonography was then used along with the previously developed plan and the Nucletron device to implant a total of 71 seeds using 15 needles. This proceeded without difficulty or complication.   I then proceeded with placement of SpaceOAR by introducing a needle with the bevel angled inferiorly approximately 2 cm superior to the anus. This was angled downward and under direct ultrasound was placed within the space between the prostatic capsule and rectum. This was confirmed with a small amount of sterile saline injected and this was performed under direct ultrasound. I then attached the SpaceOAR to the needle and injected this in the space between the prostate and rectum with  good placement noted.  A Foley catheter was then removed as well as the transrectal ultrasound probe and rectal probe. Flexible cystoscopy was then performed using the 17 French flexible scope which revealed a normal urethra throughout its length down to the sphincter which appeared intact. The prostatic urethra revealed bilobar hypertrophy but no evidence of obstruction, seeds, spacers or lesions. The bladder was then entered and fully and systematically inspected. The ureteral orifices were noted to be of normal configuration and position. The mucosa revealed no evidence of tumors. There were also no stones identified within the bladder. I noted no seeds or spacers on the floor of the bladder and retroflexion of the scope revealed no seeds protruding from the base of the prostate.  The cystoscope was then removed and the patient was awakened and taken to recovery room in stable and satisfactory condition. He tolerated procedure well and there were no intraoperative complications.

## 2024-07-19 NOTE — Discharge Instructions (Signed)
Antibiotics You may be given a prescription for an antibiotic to take when you arrive home. If so, be sure to take every tablet in the bottle, even if you are feeling better before the prescription is finished. If you begin itching, notice a rash or start to swell on your trunk, arms, legs and/or throat, immediately stop taking the antibiotic and call your Urologist. Diet Resume your usual diet when you return home. To keep your bowels moving easily and softly, drink prune, apple and cranberry juice at room temperature. You may also take a stool softener, such as Colace, which is available without prescription at local pharmacies. Daily activities No driving or heavy lifting for at least two days after the implant. No bike riding, horseback riding or riding lawn mowers for the first month after the implant. Any strenuous physical activity should be approved by your doctor before you resume it. Sexual relations You may resume sexual relations two weeks after the procedure. A condom should be used for the first two weeks. Your semen may be dark brown or black; this is normal and is related bleeding that may have occurred during the implant. Postoperative swelling Expect swelling and bruising of the scrotum and perineum (the area between the scrotum and anus). Both the swelling and the bruising should resolve in l or 2 weeks. Ice packs and over- the-counter medications such as Tylenol, Advil or Aleve may lessen your discomfort. Postoperative urination Most men experience burning on urination and/or urinary frequency. If this becomes bothersome, contact your Urologist.  Medication can be prescribed to relieve these problems.  It is normal to have some blood in your urine for a few days after the implant. Special instructions related to the seeds It is unlikely that you will pass an Iodine-125 seed in your urine. The seeds are silver in color and are about as large as a grain of rice. If you pass a seed,  do not handle it with your fingers. Use a spoon to place it in an envelope or jar in place this in base occluded area such as the garage or basement for return to the radiation clinic at your convenience.  Contact your doctor for Temperature greater than 101 F Increasing pain Inability to urinate Follow-up  You should have follow up with your urologist and radiation oncologist about 3 weeks after the procedure. General information regarding Iodine seeds Iodine-125 is a low energy radioactive material. It is not deeply penetrating and loses energy at short distances. Your prostate will absorb the radiation. Objects that are touched or used by the patient do not become radioactive. Body wastes (urine and stool) or body fluids (saliva, tears, semen or blood) are not radioactive. The Nuclear Regulatory Commission (NRC) has determined that no radiation precautions are needed for patients undergoing Iodine-125 seed implantation. The NRC states that such patients do not present a risk to the people around them, including young children and pregnant women. However, in keeping with the general principle that radiation exposure should be kept as low reasonably possible, we suggest the following: Children and pets should not sit on the patient's lap for the first two (2) weeks after the implant. Pregnant (or possibly pregnant) women should avoid prolonged, close contact with the patient for the first two (2) weeks after the implant. A distance of three (3) feet is acceptable. At a distance of three (3) feet, there is no limit to the length of time anyone can be with the patient.  

## 2024-07-19 NOTE — Anesthesia Postprocedure Evaluation (Signed)
 Anesthesia Post Note  Patient: Stephen Ferrell  Procedure(s) Performed: INSERTION, RADIATION SOURCE, PROSTATE (Prostate) INJECTION, RECTAL SPACER     Patient location during evaluation: PACU Anesthesia Type: General Level of consciousness: awake and alert Pain management: pain level controlled Vital Signs Assessment: post-procedure vital signs reviewed and stable Respiratory status: spontaneous breathing, nonlabored ventilation, respiratory function stable and patient connected to nasal cannula oxygen Cardiovascular status: blood pressure returned to baseline and stable Postop Assessment: no apparent nausea or vomiting Anesthetic complications: no   No notable events documented.  Last Vitals:  Vitals:   07/19/24 1730 07/19/24 1736  BP: (!) 122/93 (!) 131/94  Pulse: 61 65  Resp: 10   Temp: 36.4 C   SpO2: 100% 100%    Last Pain:  Vitals:   07/19/24 1736  TempSrc:   PainSc: 0-No pain                 Brier Reid

## 2024-07-19 NOTE — Progress Notes (Signed)
  Radiation Oncology         [phone]) [phone] ________________________________  Name: Stephen Ferrell MRN: [id number]  Date: 07/19/2024  DOB: 1971-02-06       Prostate Seed Implant  RR:Yldjpw, Ardell, MD  No ref. provider found  DIAGNOSIS:  53 y.o. gentleman with Stage T2a adenocarcinoma of the prostate with Gleason Score of 3+4, and PSA of 9.36.   Oncology History  Malignant neoplasm of prostate (HCC)  03/31/2024 Cancer Staging   Staging form: Prostate, AJCC 8th Edition - Clinical stage from 03/31/2024: Stage IIB (cT2a, cN0, cM0, PSA: 9.4, Grade Group: 2) - Signed by Sherwood Rise, PA-C on 05/04/2024 Histopathologic type: Adenocarcinoma, NOS Stage prefix: Initial diagnosis Prostate specific antigen (PSA) range: Less than 10 Gleason primary pattern: 3 Gleason secondary pattern: 4 Gleason score: 7 Histologic grading system: 5 grade system Number of biopsy cores examined: 16 Number of biopsy cores positive: 6 Location of positive needle core biopsies: Both sides   05/04/2024 Initial Diagnosis   Malignant neoplasm of prostate (HCC)     No diagnosis found.  PROCEDURE: Insertion of radioactive I-125 seeds into the prostate gland.  RADIATION DOSE: 145 Gy, definitive/boost therapy.  TECHNIQUE: [name] was brought to the operating room with the urologist. He was placed in the dorsolithotomy position. He was catheterized and a rectal tube was inserted. The perineum was shaved, prepped and draped. The ultrasound probe was then introduced by me into the rectum to see the prostate gland.  TREATMENT DEVICE: I attached the needle grid to the ultrasound probe stand and anchor needles were placed.  3D PLANNING: The prostate was imaged in 3D using a sagittal sweep of the prostate probe. These images were transferred to the planning computer. There, the prostate, urethra and rectum were defined on each axial reconstructed image. Then, the software created an optimized 3D plan and a few seed  positions were adjusted. The quality of the plan was reviewed using [facility] information for the target and the following two organs at risk:  Urethra and Rectum.  Then the accepted plan was printed and handed off to the radiation therapist.  Under my supervision, the custom loading of the seeds and spacers was carried out using the quick loader.  These pre-loaded needles were then placed into the needle holder.SABRA  PROSTATE VOLUME STUDY:  Using transrectal ultrasound the volume of the prostate was verified to be 56 cc.  SPECIAL TREATMENT PROCEDURE/SUPERVISION AND HANDLING: The pre-loaded needles were then delivered by the urologist under sagittal guidance. A total of 15 needles were used to deposit 71 seeds in the prostate gland. The individual seed activity was 0.513 mCi.  RECTAL SPACER:  Barrigel  COMPLEX SIMULATION: At the end of the procedure, an anterior radiograph of the pelvis was obtained to document seed positioning and count. Cystoscopy was performed by the urologist to check the urethra and bladder.  MICRODOSIMETRY: At the end of the procedure, the patient was emitting 0.1 mR/hr at 1 meter. Accordingly, he was considered safe for hospital discharge.  PLAN: The patient will return to the radiation oncology clinic for post implant CT dosimetry in three weeks.   ________________________________  Donnice FELIX Patrcia, M.D.

## 2024-07-19 NOTE — Anesthesia Preprocedure Evaluation (Addendum)
 Anesthesia Evaluation  Patient identified by MRN, date of birth, ID band Patient awake    Reviewed: Allergy  & Precautions, H&P , NPO status , Patient's Chart, lab work & pertinent test results  Airway Mallampati: II  TM Distance: >3 FB Neck ROM: Full    Dental no notable dental hx.    Pulmonary neg pulmonary ROS   Pulmonary exam normal breath sounds clear to auscultation       Cardiovascular Exercise Tolerance: Good negative cardio ROS Normal cardiovascular exam Rhythm:Regular Rate:Normal     Neuro/Psych  Headaches negative neurological ROS  negative psych ROS   GI/Hepatic negative GI ROS, Neg liver ROS, PUD,,,  Endo/Other  negative endocrine ROS    Renal/GU negative Renal ROS  negative genitourinary   Musculoskeletal negative musculoskeletal ROS (+)    Abdominal   Peds negative pediatric ROS (+)  Hematology negative hematology ROS (+)   Anesthesia Other Findings   Reproductive/Obstetrics negative OB ROS                              Anesthesia Physical Anesthesia Plan  ASA: 2  Anesthesia Plan: General   Post-op Pain Management: Minimal or no pain anticipated   Induction: Intravenous  PONV Risk Score and Plan: 2 and Ondansetron , Dexamethasone  and Treatment may vary due to age or medical condition  Airway Management Planned: LMA and Oral ETT  Additional Equipment: None  Intra-op Plan:   Post-operative Plan: Extubation in OR  Informed Consent: I have reviewed the patients History and Physical, chart, labs and discussed the procedure including the risks, benefits and alternatives for the proposed anesthesia with the patient or authorized representative who has indicated his/her understanding and acceptance.       Plan Discussed with: CRNA and Anesthesiologist  Anesthesia Plan Comments: (  )         Anesthesia Quick Evaluation

## 2024-07-19 NOTE — Anesthesia Procedure Notes (Signed)
 Procedure Name: Intubation Date/Time: 07/19/2024 3:35 PM  Performed by: Obadiah Reyes BROCKS, CRNAPre-anesthesia Checklist: Patient identified, Emergency Drugs available, Suction available and Patient being monitored Patient Re-evaluated:Patient Re-evaluated prior to induction Oxygen Delivery Method: Circle System Utilized Preoxygenation: Pre-oxygenation with 100% oxygen Induction Type: IV induction Ventilation: Mask ventilation without difficulty Laryngoscope Size: Miller and 2 Grade View: Grade I Tube type: Oral Number of attempts: 1 Airway Equipment and Method: Stylet and Oral airway Placement Confirmation: ETT inserted through vocal cords under direct vision, positive ETCO2 and breath sounds checked- equal and bilateral Secured at: 23 cm Tube secured with: Tape Dental Injury: Teeth and Oropharynx as per pre-operative assessment

## 2024-07-21 ENCOUNTER — Encounter (HOSPITAL_COMMUNITY): Payer: Self-pay | Admitting: Urology

## 2024-07-22 ENCOUNTER — Other Ambulatory Visit: Payer: Self-pay | Admitting: Urology

## 2024-07-22 DIAGNOSIS — C61 Malignant neoplasm of prostate: Secondary | ICD-10-CM

## 2024-07-22 NOTE — Progress Notes (Signed)
 Patient was a RadOnc Consult on 05/03/24 for his Stage T2a adenocarcinoma of the prostate with Gleason score of 3+4, and PSA of 9.36.  Patient proceed with treatment recommendations of brachytherapy and had his treatment on 07/19/24.   Patient is scheduled for a post seed CT Sim on 08/11/24 and will have urology follow up on 08/02/24.

## 2024-07-27 ENCOUNTER — Other Ambulatory Visit: Payer: Self-pay

## 2024-07-27 ENCOUNTER — Ambulatory Visit
Admission: RE | Admit: 2024-07-27 | Discharge: 2024-07-27 | Disposition: A | Discharge status: Home / Self Care | Source: Ambulatory Visit | Attending: Urology | Admitting: Urology

## 2024-07-27 ENCOUNTER — Other Ambulatory Visit: Payer: Self-pay | Admitting: Urology

## 2024-07-27 ENCOUNTER — Telehealth: Payer: Self-pay

## 2024-07-27 DIAGNOSIS — C61 Malignant neoplasm of prostate: Secondary | ICD-10-CM | POA: Diagnosis present

## 2024-07-27 LAB — URINALYSIS, COMPLETE (UACMP) WITH MICROSCOPIC
Bacteria, UA: NONE SEEN
Bilirubin Urine: NEGATIVE
Glucose, UA: NEGATIVE mg/dL
Ketones, ur: NEGATIVE mg/dL
Nitrite: NEGATIVE
Protein, ur: 30 mg/dL — AB
Specific Gravity, Urine: 1.026 (ref 1.005–1.030)
pH: 5 (ref 5.0–8.0)

## 2024-07-27 MED ORDER — CIPROFLOXACIN HCL 500 MG PO TABS: 500.0000 mg | ORAL_TABLET | Freq: Two times a day (BID) | ORAL | 0 refills | Status: DC

## 2024-07-27 NOTE — Telephone Encounter (Signed)
 RN received call from patient per Prostate navigator with complaints of urinary frequency/urgency, dysuria, not feeling like bladder is quite empty, and some blood-tinged urine.  Stephen Ferrell recently had Brachytherapy procedure on 07/19/2024 and day or two after started having symptoms and just wanted to rule out UTI.  Stephen Ferrell denies fever or chills at this time.  RN advised him to come in to give urine sample for testing this afternoon.  Stephen Ferrell agreed to come in give urine sample this afternoon at 1:30 pm.  Ashlyn Bruning, PA-C and Dr. Patrcia made aware.

## 2024-07-28 ENCOUNTER — Telehealth: Payer: Self-pay

## 2024-07-28 ENCOUNTER — Other Ambulatory Visit: Payer: Self-pay | Admitting: Urology

## 2024-07-28 LAB — URINE CULTURE: Culture: NO GROWTH

## 2024-07-28 MED ORDER — SULFAMETHOXAZOLE-TRIMETHOPRIM 800-160 MG PO TABS: 1.0000 | ORAL_TABLET | Freq: Two times a day (BID) | ORAL | 0 refills | Status: AC

## 2024-07-28 NOTE — Telephone Encounter (Signed)
 RN called patient back to inform him that the prescription for the Cipro  was cancelled and that prescription for Bactrim  DS was sent to his pharmacy per [employer], PA-C.  Appreciative of the call and will go pick up prescription this afternoon.

## 2024-07-29 ENCOUNTER — Telehealth: Payer: Self-pay

## 2024-07-29 NOTE — Telephone Encounter (Signed)
 RN notified patient of urine culture results no growth reported do not have to start antibiotics.  RN advised to continue AZO if needed and drink plenty water .  Patient was appreciative of call and thanks everyone for their assistance.

## 2024-07-30 NOTE — Progress Notes (Signed)
 Post-seed nursing interview for a diagnosis of 53 y.o. 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    Patient identity verified x2.   Patient states issues as follows...  -Pain: No -Fatigue: No -Abdomen: No -Groin: No -Urinary: some dysuria (saw [facility], culture here was neg), frequency and nocturia -Bowels: Patient has ulcerative colitis -Appetite: Normal -Weight: 182.6  Patient denies all other related issues at this time.  Meaningful use complete.  I-PSS (AUA) score- 21 - Severe SHIM (ED) score- 23 Urinary Management medication(s): None Urology appointment date-  No upcoming appointment with Dr. Sherwood Edison, patient will call to schedule  Vitals- BP (!) 149/85 (BP Location: Left Arm, Patient Position: Sitting, Cuff Size: Large)   Pulse 73   Temp 97.6 F (36.4 C)   Resp 18   Ht 6' 6 (1.981 m)   Wt 182 lb 9.6 oz (82.8 kg)   SpO2 99%   BMI 21.10 kg/m    This concludes the nursing interview.

## 2024-08-10 ENCOUNTER — Telehealth: Payer: Self-pay | Admitting: *Deleted

## 2024-08-10 NOTE — Telephone Encounter (Signed)
 Called patient to remind of post seed appts and MRI for 08-11-24, spoke with patient and he is aware of these appts. and the instructions

## 2024-08-11 ENCOUNTER — Ambulatory Visit
Admission: RE | Admit: 2024-08-11 | Discharge: 2024-08-11 | Disposition: A | Discharge status: Home / Self Care | Source: Ambulatory Visit | Attending: Urology | Admitting: Urology

## 2024-08-11 ENCOUNTER — Ambulatory Visit
Admission: RE | Admit: 2024-08-11 | Discharge: 2024-08-11 | Disposition: A | Discharge status: Home / Self Care | Source: Ambulatory Visit | Attending: Radiation Oncology | Admitting: Radiation Oncology

## 2024-08-11 ENCOUNTER — Ambulatory Visit (HOSPITAL_COMMUNITY)
Admission: RE | Admit: 2024-08-11 | Discharge: 2024-08-11 | Disposition: A | Discharge status: Home / Self Care | Source: Ambulatory Visit | Attending: Urology | Admitting: Urology

## 2024-08-11 VITALS — BP 149/85 | HR 73 | Temp 97.6°F | Resp 18 | Ht 78.0 in | Wt 182.6 lb

## 2024-08-11 DIAGNOSIS — C61 Malignant neoplasm of prostate: Secondary | ICD-10-CM

## 2024-08-11 NOTE — Progress Notes (Signed)
" °  Radiation Oncology         [phone]) [phone] ________________________________  Name: Stephen Ferrell MRN: [id number]  Date: 08/11/2024  DOB: 04-Jan-1971  COMPLEX SIMULATION NOTE  NARRATIVE:  The patient was brought to the CT Simulation planning suite today following prostate seed implantation approximately one month ago.  Identity was confirmed.  All relevant records and images related to the planned course of therapy were reviewed.  Then, the patient was set-up supine.  CT images were obtained.  The CT images were loaded into the planning software.  Then the prostate and rectum were contoured.  Treatment planning then occurred.  The implanted iodine 125 seeds were identified by the physics staff for projection of radiation distribution  I have requested : 3D Simulation  I have requested a DVH of the following structures: Prostate and rectum.    ________________________________  Stephen Ferrell Patrcia, M.D.  "

## 2024-08-11 NOTE — Progress Notes (Incomplete)
 " Radiation Oncology         [phone] ________________________________  Name: Stephen Ferrell MRN: [id number]  Date: 08/11/2024  DOB: 16-Aug-1970  Post-Seed Follow-Up Visit Note  CC: Stephen Other, MD  Stephen Sherwood [name], MD  Diagnosis:   53 y.o. gentleman with Stage T2a adenocarcinoma of the prostate with Gleason Score of 3+4, and PSA of 9.36.     ICD-10-CM   1. Malignant neoplasm of prostate (HCC)  C61       Interval Since Last Radiation:  3 weeks 07/19/24:  Insertion of radioactive I-125 seeds into the prostate gland; 145 Gy, definitive therapy with placement of Barrigel.  Narrative:  The patient returns today for routine follow-up.  He is complaining of increased urinary frequency and urinary hesitation symptoms. He filled out a questionnaire regarding urinary function today providing and overall IPSS score of *** characterizing his symptoms as ***.  His pre-implant score was 10. He denies any bowel symptoms.  ALLERGIES:  is allergic to honey bee venom and Ferrell.  Meds: Current Outpatient Medications  Medication Sig Dispense Refill   acetaminophen  (TYLENOL ) 500 MG tablet Take 500 mg by mouth every 6 (six) hours as needed for moderate pain (pain score 4-6).     AUVI-Q  0.3 MG/0.3ML SOAJ injection Use as directed for life-threatening allergic reaction. 2 Device 0   Budesonide ER 9 MG TB24 Take 9 mg by mouth daily as needed (ulcerative colitis).     cholecalciferol (VITAMIN D3) 25 MCG (1000 UNIT) tablet Take 1,000 Units by mouth daily.     HYDROcodone -acetaminophen  (NORCO/VICODIN) 5-325 MG tablet Take 1 tablet by mouth every 6 (six) hours as needed. 10 tablet 0   hydrocortisone (ANUSOL-HC) 25 MG suppository Place 25 mg rectally as needed for hemorrhoids or anal itching.     mesalamine (LIALDA) 1.2 g EC tablet Take 4.8 g by mouth daily.      Multiple Vitamin (MULTI-VITAMIN) tablet Take 1 tablet by mouth daily.     sulfamethoxazole -trimethoprim  (BACTRIM  DS) 800-160 MG tablet Take  1 tablet by mouth 2 (two) times daily. 14 tablet 0   SUMAtriptan (IMITREX) 25 MG tablet Take 25 mg by mouth as needed for migraine.     No current facility-administered medications for this visit.    Physical Findings: In general this is a well appearing Caucasian male in no acute distress. He's alert and oriented x4 and appropriate throughout the examination. Cardiopulmonary assessment is negative for acute distress and he exhibits normal effort.   Lab Findings: Lab Results  Component Value Date   WBC 5.6 07/01/2024   HGB 15.4 07/01/2024   HCT 45.0 07/01/2024   MCV 90.9 07/01/2024   PLT 217 07/01/2024    Radiographic Findings:  Patient underwent CT imaging in our clinic for post implant dosimetry. The CT will be fused with his prostate MRI that will be performed at 1:30 pm today and will be reviewed by Dr. Patrcia to confirm there is an adequate distribution of radioactive seeds throughout the prostate gland and ensure that there are no seeds in or near the rectum. We suspect the final radiation plan and dosimetry will show appropriate coverage of the prostate gland. He understands that we will call and inform him of any unexpected findings on further review of his imaging and dosimetry.  Impression/Plan:  53 y.o. 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  The patient is recovering from the effects of radiation. His urinary  symptoms should gradually improve over the next 4-6 months. We talked about this today. He is encouraged by his improvement already and is otherwise pleased with his outcome. We also talked about long-term follow-up for prostate cancer following seed implant. He understands that ongoing PSA determinations and digital rectal exams will help perform surveillance to rule out disease recurrence. He has a follow up appointment scheduled with Dr. Carolee on ***. He understands what to expect with his PSA measures. Patient  was also educated today about some of the long-term effects from radiation including a small risk for rectal bleeding and possibly erectile dysfunction. We talked about some of the general management approaches to these potential complications. However, I did encourage the patient to contact our office or return at any point if he has questions or concerns related to his previous radiation and prostate cancer.    Sabra MICAEL Rusk, PA-C  "

## 2024-08-13 ENCOUNTER — Other Ambulatory Visit: Payer: Self-pay | Admitting: Urology

## 2024-08-13 ENCOUNTER — Ambulatory Visit (HOSPITAL_COMMUNITY)

## 2024-08-13 DIAGNOSIS — C61 Malignant neoplasm of prostate: Secondary | ICD-10-CM

## 2024-08-13 NOTE — Progress Notes (Signed)
 " Radiation Oncology         [phone] ________________________________  Name: Stephen Ferrell MRN: [id number]  Date: 08/11/2024  DOB: 1971-01-26  Post-Seed Follow-Up Visit Note  CC: [name], MD  Carolee Sherwood [name], MD  Diagnosis:    54 y.o. 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     ICD-10-CM   1. Malignant neoplasm of prostate (HCC)  C61       Interval Since Last Radiation:  3 weeks 07/19/24:  Insertion of radioactive I-125 seeds into the prostate gland; 145 Gy, definitive therapy with placement of SpaceOAR gel.  Narrative:  The patient returns today for routine follow-up.  He is complaining of increased urinary frequency and urinary hesitation symptoms. He filled out a questionnaire regarding urinary function today providing and overall IPSS score of 21 characterizing his symptoms as severe but improving since starting Flomax 2 days ago.  He has had some mild dysuria at the start of his stream and discomfort with ejaculation but denies gross hematuria, straining to void or incontinence.  His pre-implant score was 10. He denies any abdominal pain or bowel symptoms aside from his usual ulcerative colitis symptoms which have remained unchanged.  ALLERGIES:  is allergic to honey bee venom and other.  Meds: Current Outpatient Medications  Medication Sig Dispense Refill   acetaminophen  (TYLENOL ) 500 MG tablet Take 500 mg by mouth every 6 (six) hours as needed for moderate pain (pain score 4-6).     AUVI-Q  0.3 MG/0.3ML SOAJ injection Use as directed for life-threatening allergic reaction. 2 Device 0   Budesonide ER 9 MG TB24 Take 9 mg by mouth daily as needed (ulcerative colitis).     cholecalciferol (VITAMIN D3) 25 MCG (1000 UNIT) tablet Take 1,000 Units by mouth daily.     HYDROcodone -acetaminophen  (NORCO/VICODIN) 5-325 MG tablet Take 1 tablet by mouth every 6 (six) hours as needed. 10 tablet 0   hydrocortisone  (ANUSOL-HC) 25 MG suppository Place 25 mg rectally as needed for hemorrhoids or anal itching.     mesalamine (LIALDA) 1.2 g EC tablet Take 4.8 g by mouth daily.      Multiple Vitamin (MULTI-VITAMIN) tablet Take 1 tablet by mouth daily.     sulfamethoxazole -trimethoprim  (BACTRIM  DS) 800-160 MG tablet Take 1 tablet by mouth 2 (two) times daily. 14 tablet 0   SUMAtriptan (IMITREX) 25 MG tablet Take 25 mg by mouth as needed for migraine.     tamsulosin (FLOMAX) 0.4 MG CAPS capsule Take 0.4 mg by mouth.     No current facility-administered medications for this encounter.    Physical Findings: In general this is a well appearing Caucasian male in no acute distress. He's alert and oriented x4 and appropriate throughout the examination. Cardiopulmonary assessment is negative for acute distress and he exhibits normal effort.   Lab Findings: Lab Results  Component Value Date   WBC 5.6 07/01/2024   HGB 15.4 07/01/2024   HCT 45.0 07/01/2024   MCV 90.9 07/01/2024   PLT 217 07/01/2024    Radiographic Findings:  Patient underwent CT imaging in our clinic for post implant dosimetry. The CT will be fused with his prostate MRI that will be performed 1:30 PM today and will be reviewed by Dr. Patrcia to confirm there is an adequate distribution of radioactive seeds throughout the prostate gland and ensure that there are no seeds in or near the rectum. We suspect the final radiation plan and  dosimetry will show appropriate coverage of the prostate gland. He understands that we will call and inform him of any unexpected findings on further review of his imaging and dosimetry.  Impression/Plan:  54 y.o. 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  The patient is recovering from the effects of radiation. His urinary symptoms should gradually improve over the next 4-6 months. We talked about this today and he will continue taking the Flomax as prescribed and  utilize sitz bath's twice daily and 400 mg Advil twice daily as needed. He is encouraged by his improvement already and is otherwise pleased with his outcome. We also talked about long-term follow-up for prostate cancer following seed implant. He understands that ongoing PSA determinations and digital rectal exams will help perform surveillance to rule out disease recurrence. He has a follow up appointment scheduled with Dr. Carolee in March 2026 prior to relocating to [facility], [place]  for his new job at [employer] [place]. He understands what to expect with his PSA measures. Patient was also educated today about some of the long-term effects from radiation including a small risk for rectal bleeding and possibly erectile dysfunction. We talked about some of the general management approaches to these potential complications. However, I did encourage the patient to contact our office or return at any point if he has questions or concerns related to his previous radiation and prostate cancer.    Sabra MICAEL Rusk, PA-C  "

## 2024-08-17 ENCOUNTER — Encounter: Payer: Self-pay | Admitting: Radiation Oncology

## 2024-08-17 ENCOUNTER — Ambulatory Visit
Admission: RE | Admit: 2024-08-17 | Discharge: 2024-08-17 | Disposition: A | Discharge status: Home / Self Care | Source: Ambulatory Visit | Attending: Radiation Oncology | Admitting: Radiation Oncology

## 2024-08-17 DIAGNOSIS — C61 Malignant neoplasm of prostate: Secondary | ICD-10-CM | POA: Insufficient documentation

## 2024-08-17 NOTE — Progress Notes (Signed)
" °  Radiation Oncology         [phone] ________________________________  Name: Stephen Ferrell MRN: [id number]  Date: 08/17/2024  DOB: Apr 06, 1971  3D Planning Note   Prostate Brachytherapy Post-Implant Dosimetry  Diagnosis: 54 y.o. gentleman with Stage T2a adenocarcinoma of the prostate with Gleason Score of 3+4, and PSA of 9.36.   Narrative: On a previous date, Stephen Ferrell returned following prostate seed implantation for post implant planning. He underwent CT scan complex simulation to delineate the three-dimensional structures of the pelvis and demonstrate the radiation distribution.  Since that time, the seed localization, and complex isodose planning with dose volume histograms have now been completed.  Results:   Prostate Coverage - The dose of radiation delivered to the 90% or more of the prostate gland (D90) was 113.59% of the prescription dose. This exceeds our goal of greater than 90%. Rectal Sparing - The volume of rectal tissue receiving the prescription dose or higher was 0.0 cc using Barrigel. This falls under our thresholds tolerance of 1.0 cc.  Impression: The prostate seed implant appears to show adequate target coverage and appropriate rectal sparing.  Plan:  The patient will continue to follow with urology for ongoing PSA determinations. I would anticipate a high likelihood for local tumor control with minimal risk for rectal morbidity.  ________________________________  Donnice FELIX Patrcia, M.D. "

## 2024-08-17 NOTE — Radiation Completion Notes (Signed)
 Patient Name: MADDIX, HEINZ MRN: [id number] Date of Birth: 1971-04-17 Referring Physician: Sherwood Edison, M.D. Date of Service: 2024-08-17 Radiation Oncologist: Adina Barge, M.D. [facility]                             RADIATION ONCOLOGY END OF TREATMENT NOTE     Diagnosis: C61 Malignant neoplasm of prostate Staging on 2024-03-31: Malignant neoplasm of prostate (HCC) T=cT2a, N=cN0, M=cM0 Intent: Curative     ==========DELIVERED PLANS==========  Prostate Seed Implant Date: 2024-07-19   Plan Name: Prostate Seed Implant Site: Prostate Technique: Radioactive Seed Implant I-125 Mode: Brachytherapy Dose Per Fraction: 145 Gy Prescribed Dose (Delivered / Prescribed): 145 Gy / 145 Gy Prescribed Fxs (Delivered / Prescribed): 1 / 1     ==========ON TREATMENT VISIT DATES========== 2024-07-19     ==========UPCOMING VISITS==========
